# Patient Record
Sex: Male | Born: 1950
Health system: Southern US, Community
[De-identification: ages and names within clinical notes are randomized; demographics above are authoritative.]

## PROBLEM LIST (undated history)

## (undated) DIAGNOSIS — K219 Gastro-esophageal reflux disease without esophagitis: Secondary | ICD-10-CM

## (undated) DIAGNOSIS — I10 Essential (primary) hypertension: Secondary | ICD-10-CM

## (undated) DIAGNOSIS — G5601 Carpal tunnel syndrome, right upper limb: Secondary | ICD-10-CM

## (undated) DIAGNOSIS — M1990 Unspecified osteoarthritis, unspecified site: Secondary | ICD-10-CM

## (undated) DIAGNOSIS — Z8489 Family history of other specified conditions: Secondary | ICD-10-CM

## (undated) DIAGNOSIS — Z972 Presence of dental prosthetic device (complete) (partial): Secondary | ICD-10-CM

## (undated) HISTORY — PX: OTHER SURGICAL HISTORY: SHX169

## (undated) HISTORY — PX: TONSILLECTOMY: SUR1361

---

## 1996-11-05 HISTORY — PX: HAND TENDON SURGERY: SHX663

## 2004-03-24 ENCOUNTER — Ambulatory Visit (HOSPITAL_BASED_OUTPATIENT_CLINIC_OR_DEPARTMENT_OTHER): Admission: RE | Admit: 2004-03-24 | Discharge: 2004-03-24 | Payer: Self-pay | Admitting: Orthopedic Surgery

## 2004-03-24 ENCOUNTER — Ambulatory Visit (HOSPITAL_COMMUNITY): Admission: RE | Admit: 2004-03-24 | Discharge: 2004-03-24 | Payer: Self-pay | Admitting: Orthopedic Surgery

## 2004-09-19 ENCOUNTER — Ambulatory Visit (HOSPITAL_COMMUNITY): Admission: RE | Admit: 2004-09-19 | Discharge: 2004-09-19 | Payer: Self-pay | Admitting: Orthopedic Surgery

## 2004-09-19 ENCOUNTER — Ambulatory Visit (HOSPITAL_BASED_OUTPATIENT_CLINIC_OR_DEPARTMENT_OTHER): Admission: RE | Admit: 2004-09-19 | Discharge: 2004-09-19 | Payer: Self-pay | Admitting: Orthopedic Surgery

## 2011-03-07 ENCOUNTER — Other Ambulatory Visit: Payer: Self-pay | Admitting: Internal Medicine

## 2011-03-07 DIAGNOSIS — M5430 Sciatica, unspecified side: Secondary | ICD-10-CM

## 2011-03-07 DIAGNOSIS — M545 Low back pain: Secondary | ICD-10-CM

## 2011-03-12 ENCOUNTER — Ambulatory Visit
Admission: RE | Admit: 2011-03-12 | Discharge: 2011-03-12 | Disposition: A | Discharge status: Home / Self Care | Payer: 59 | Source: Ambulatory Visit | Attending: Internal Medicine | Admitting: Internal Medicine

## 2011-03-12 DIAGNOSIS — M5430 Sciatica, unspecified side: Secondary | ICD-10-CM

## 2011-03-12 DIAGNOSIS — M545 Low back pain: Secondary | ICD-10-CM

## 2011-04-04 ENCOUNTER — Other Ambulatory Visit: Payer: Self-pay | Admitting: Neurosurgery

## 2011-04-04 DIAGNOSIS — M25552 Pain in left hip: Secondary | ICD-10-CM

## 2011-04-05 ENCOUNTER — Ambulatory Visit
Admission: RE | Admit: 2011-04-05 | Discharge: 2011-04-05 | Disposition: A | Discharge status: Home / Self Care | Payer: 59 | Source: Ambulatory Visit | Attending: Neurosurgery | Admitting: Neurosurgery

## 2011-04-05 DIAGNOSIS — M25552 Pain in left hip: Secondary | ICD-10-CM

## 2011-06-21 ENCOUNTER — Other Ambulatory Visit: Payer: Self-pay | Admitting: Neurosurgery

## 2011-06-21 DIAGNOSIS — M25559 Pain in unspecified hip: Secondary | ICD-10-CM

## 2011-06-27 ENCOUNTER — Ambulatory Visit
Admission: RE | Admit: 2011-06-27 | Discharge: 2011-06-27 | Disposition: A | Discharge status: Home / Self Care | Payer: 59 | Source: Ambulatory Visit | Attending: Neurosurgery | Admitting: Neurosurgery

## 2011-06-27 DIAGNOSIS — M25559 Pain in unspecified hip: Secondary | ICD-10-CM

## 2011-06-27 MED ORDER — IOHEXOL 180 MG/ML  SOLN: 1.0000 mL | Freq: Once | INTRAMUSCULAR | Status: DC | PRN

## 2011-06-27 MED ORDER — METHYLPREDNISOLONE ACETATE 40 MG/ML INJ SUSP (RADIOLOG
120.0000 mg | Freq: Once | INTRAMUSCULAR | Status: AC
  Administered 2011-06-27: 120 mg via INTRA_ARTICULAR

## 2011-06-27 MED ORDER — IOHEXOL 180 MG/ML  SOLN
1.0000 mL | Freq: Once | INTRAMUSCULAR | Status: AC | PRN
  Administered 2011-06-27: 1 mL via INTRA_ARTICULAR

## 2011-09-24 ENCOUNTER — Encounter (HOSPITAL_COMMUNITY): Payer: Self-pay | Admitting: Pharmacy Technician

## 2011-09-25 ENCOUNTER — Ambulatory Visit (HOSPITAL_COMMUNITY)
Admission: RE | Admit: 2011-09-25 | Discharge: 2011-09-25 | Disposition: A | Discharge status: Home / Self Care | Payer: 59 | Source: Ambulatory Visit | Attending: Orthopedic Surgery | Admitting: Orthopedic Surgery

## 2011-09-25 ENCOUNTER — Other Ambulatory Visit: Payer: Self-pay | Admitting: Orthopedic Surgery

## 2011-09-25 ENCOUNTER — Encounter (HOSPITAL_COMMUNITY): Payer: Self-pay

## 2011-09-25 ENCOUNTER — Encounter (HOSPITAL_COMMUNITY)
Admission: RE | Admit: 2011-09-25 | Discharge: 2011-09-25 | Disposition: A | Discharge status: Home / Self Care | Payer: 59 | Source: Ambulatory Visit | Attending: Orthopedic Surgery | Admitting: Orthopedic Surgery

## 2011-09-25 DIAGNOSIS — Z01812 Encounter for preprocedural laboratory examination: Secondary | ICD-10-CM | POA: Insufficient documentation

## 2011-09-25 DIAGNOSIS — M169 Osteoarthritis of hip, unspecified: Secondary | ICD-10-CM | POA: Insufficient documentation

## 2011-09-25 DIAGNOSIS — M1610 Unilateral primary osteoarthritis, unspecified hip: Secondary | ICD-10-CM | POA: Insufficient documentation

## 2011-09-25 DIAGNOSIS — M25559 Pain in unspecified hip: Secondary | ICD-10-CM | POA: Insufficient documentation

## 2011-09-25 HISTORY — DX: Unspecified osteoarthritis, unspecified site: M19.90

## 2011-09-25 HISTORY — DX: Gastro-esophageal reflux disease without esophagitis: K21.9

## 2011-09-25 LAB — CBC
MCH: 32.5 pg (ref 26.0–34.0)
MCV: 91.9 fL (ref 78.0–100.0)
Platelets: 281 10*3/uL (ref 150–400)
RDW: 13 % (ref 11.5–15.5)

## 2011-09-25 LAB — COMPREHENSIVE METABOLIC PANEL
AST: 20 U/L (ref 0–37)
Albumin: 3.9 g/dL (ref 3.5–5.2)
Calcium: 9 mg/dL (ref 8.4–10.5)
Creatinine, Ser: 0.95 mg/dL (ref 0.50–1.35)
GFR calc non Af Amer: 89 mL/min — ABNORMAL LOW (ref >90)

## 2011-09-25 LAB — URINALYSIS, ROUTINE W REFLEX MICROSCOPIC
Bilirubin Urine: NEGATIVE
Hgb urine dipstick: NEGATIVE
Ketones, ur: NEGATIVE mg/dL
Protein, ur: NEGATIVE mg/dL
Urobilinogen, UA: 1 mg/dL (ref 0.0–1.0)

## 2011-09-25 LAB — APTT: aPTT: 35 seconds (ref 24–37)

## 2011-09-25 LAB — PROTIME-INR
INR: 1.03 (ref 0.00–1.49)
Prothrombin Time: 13.7 seconds (ref 11.6–15.2)

## 2011-09-25 MED ORDER — CHLORHEXIDINE GLUCONATE 4 % EX LIQD: 60.0000 mL | Freq: Once | CUTANEOUS | Status: DC

## 2011-09-25 NOTE — H&P (Signed)
George Roberts  DOB: 04/22/1951  Date Of Admission:  10/02/2101  Chief Complaint:  Left Hip Pain  History of Present Illness The patient is a 60 year old male who comes in today for a preoperative History and Physical. The patient is scheduled for a left total hip arthroplasty to be performed by Dr. Frank V. Aluisio, MD at [facility] on 10/03/2011.  Allergies No Known Drug Allergies. Vicodin *ANALGESICS - OPIOID*. Headache. *INTOLERANCE ONLY* Patient has taken Percocet with previous surgery and tolerated before.  Medication History Ibuprofen (200MG Capsule, Oral) Active.  Problem List/Past Medical Osteoarthritis  Family History Diabetes Mellitus. father Heart Disease. father Congestive Heart Failure. father and grandmother fathers side Cancer. grandfather mothers side Hypertension. father Rheumatoid Arthritis. grandmother mothers side  Social History Illicit drug use. no Living situation. live with spouse Marital status. married Current work status. Retired. Tobacco use. former smoker; smoke(d) 1 1/2 pack(s) per day Exercise. Exercises rarely; does other Children. 2 Alcohol use. current drinker; only occasionally per week  Past Surgical History Left Hand Surgery. Date: 2005.  Review of Systems General:Not Present- Chills, Fever, Night Sweats, Appetite Loss, Fatigue, Feeling sick, Weight Gain and Weight Loss. Skin:Not Present- Itching, Rash, Skin Color Changes, Ulcer, Psoriasis and Change in Hair or Nails. HEENT:Not Present- Sensitivity to light, Hearing problems, Nose Bleed and Ringing in the Ears. Neck:Not Present- Swollen Glands and Neck Mass. Respiratory:Not Present- Snoring, Chronic Cough, Bloody sputum and Dyspnea. Cardiovascular:Not Present- Shortness of Breath, Chest Pain, Swelling of Extremities, Leg Cramps and Palpitations. Gastrointestinal:Not Present- Bloody Stool, Heartburn, Abdominal Pain, Vomiting,  Nausea and Incontinence of Stool. Male Genitourinary:Not Present- Blood in Urine, Frequency, Incontinence and Nocturia. Musculoskeletal:Present- Joint Pain and Back Pain. Not Present- Muscle Weakness, Muscle Pain, Joint Stiffness and Joint Swelling. Neurological:Not Present- Tingling, Numbness, Burning, Tremor, Headaches and Dizziness. Psychiatric:Not Present- Anxiety, Depression and Memory Loss.  Endocrine:Not Present- Cold Intolerance, Heat Intolerance, Excessive hunger and Excessive Thirst. Hematology:Not Present- Abnormal Bleeding, Anemia, Blood Clots and Easy Bruising.  Vitals Weight: 195 lb Height: 69 in Weight was reported by patient. Height was reported by patient. Body Surface Area: 2.08 m Body Mass Index: 28.8 kg/m Pulse: 72 (Regular) Resp.: 12 (Unlabored) BP: 138/82 (Sitting, Left Arm, Standard)  Physical Exam The physical exam findings are as follows: Note: Patient is a 60 year old male seen for ongoing left hip problems.  General Mental Status - Alert, cooperative and good historian. General Appearance- pleasant and Cooperative. Not in acute distress. Orientation- Oriented X3. Build & Nutrition- Well nourished and Well developed. Note: Accompanied with his wife Becky.  Head and Neck Head- normocephalic, atraumatic . Neck Global Assessment- supple. no bruit auscultated on the right and no bruit auscultated on the left.  Eye Pupil- Bilateral- Normal, Regular and Round. Motion- Bilateral- EOMI.  Chest and Lung Exam Auscultation: Breath sounds:- clear at anterior chest wall and - clear at posterior chest wall. Adventitious sounds:- No Adventitious sounds.  Cardiovascular Auscultation:Rhythm- Regular rate and rhythm. Heart Sounds- S1 WNL and S2 WNL. Murmurs & Other Heart Sounds:Auscultation of the heart reveals - No Murmurs.  Abdomen Palpation/Percussion:Tenderness- Abdomen is non-tender to palpation. Rigidity  (guarding)- Abdomen is soft. Auscultation:Auscultation of the abdomen reveals - Bowel sounds normal.  Male Genitourinary Note: Not done, not pertinent to present illness  Musculoskeletal Note: On physical exam a well developed male, alert and oriented in no apparent distress. The left hip can be flexed to 95, no internal rotation, but 10 degrees of external rotation, 20 degrees of   abduction. He does have discomfort on attempted range of motion of the left hip. The right hip has normal motion.  RADIOGRAPHS: Reviewed his x rays from visit in August. He does have bone on bone change in the left hip.  Assessment & Plan Osteoarthritis Left Hip  Note: Plan is for a left total hip replacement. Surgey to be done by Dr. Aluisio.  Risks and benefits of the surgery have been discussed with the patient and they elect to proceed with surgery.  There are on active contraindications to upcoming procedure such as ongoing infection or progressive neurological disease.  Drew Perkins, PA-C   

## 2011-09-25 NOTE — Patient Instructions (Signed)
20 KROY SPRUNG  09/25/2011   Your procedure is scheduled on: 10/03/11 1130am-115 pm  Report to [facility] at 0930 AM.  Call this number if you have problems the morning of surgery: [phone]   Remember:   Do not eat food:After Midnight.  Do not drink clear liquids: After Midnight.  Take these medicines the morning of surgery with A SIP OF WATER: none    Do not wear jewelry,   Do not wear lotions, powders, or perfumes.      Do not bring valuables to the hospital.  Contacts, dentures or bridgework may not be worn into surgery.  Leave suitcase in the car. After surgery it may be brought to your room.  For patients admitted to the hospital, checkout time is 11:00 AM the day of discharge.       Special Instructions: CHG Shower Use Special Wash: 1/2 bottle night before surgery and 1/2 bottle morning of surgery. Shower chin to toes with CHG.  Wash face and private parts with regular soap.   Please read over the following fact sheets that you were given: MRSA Information, Blood transfusion fact sheet, leg exercises, coughing and deep breathing exercises, Incentive Spirometry fact sheet

## 2011-10-03 ENCOUNTER — Encounter (HOSPITAL_COMMUNITY): Payer: Self-pay | Admitting: Anesthesiology

## 2011-10-03 ENCOUNTER — Inpatient Hospital Stay (HOSPITAL_COMMUNITY)
Admission: RE | Admit: 2011-10-03 | Discharge: 2011-10-05 | DRG: 470 | Disposition: A | Discharge status: Home Health | Payer: 59 | Source: Ambulatory Visit | Attending: Orthopedic Surgery | Admitting: Orthopedic Surgery

## 2011-10-03 ENCOUNTER — Inpatient Hospital Stay (HOSPITAL_COMMUNITY): Payer: 59

## 2011-10-03 ENCOUNTER — Encounter (HOSPITAL_COMMUNITY): Payer: Self-pay | Admitting: *Deleted

## 2011-10-03 ENCOUNTER — Encounter (HOSPITAL_COMMUNITY)
Admission: RE | Disposition: A | Discharge status: Home Health | Payer: Self-pay | Source: Ambulatory Visit | Attending: Orthopedic Surgery

## 2011-10-03 ENCOUNTER — Inpatient Hospital Stay (HOSPITAL_COMMUNITY): Payer: 59 | Admitting: Anesthesiology

## 2011-10-03 DIAGNOSIS — M169 Osteoarthritis of hip, unspecified: Principal | ICD-10-CM | POA: Diagnosis present

## 2011-10-03 DIAGNOSIS — K219 Gastro-esophageal reflux disease without esophagitis: Secondary | ICD-10-CM | POA: Diagnosis present

## 2011-10-03 DIAGNOSIS — M1610 Unilateral primary osteoarthritis, unspecified hip: Principal | ICD-10-CM | POA: Diagnosis present

## 2011-10-03 HISTORY — PX: TOTAL HIP ARTHROPLASTY: SHX124

## 2011-10-03 LAB — TYPE AND SCREEN
ABO/RH(D): O POS
Antibody Screen: NEGATIVE

## 2011-10-03 LAB — ABO/RH: ABO/RH(D): O POS

## 2011-10-03 SURGERY — ARTHROPLASTY, HIP, TOTAL,POSTERIOR APPROACH
Anesthesia: General | Site: Hip | Laterality: Left | Wound class: Clean

## 2011-10-03 MED ORDER — BUPIVACAINE LIPOSOME 1.3 % IJ SUSP
20.0000 mL | Freq: Once | INTRAMUSCULAR | Status: AC
  Administered 2011-10-03: 20 mL
  Filled 2011-10-03: qty 20

## 2011-10-03 MED ORDER — METHOCARBAMOL 100 MG/ML IJ SOLN
500.0000 mg | Freq: Four times a day (QID) | INTRAVENOUS | Status: DC | PRN
  Administered 2011-10-03: 500 mg via INTRAVENOUS
  Filled 2011-10-03: qty 5

## 2011-10-03 MED ORDER — MORPHINE SULFATE 4 MG/ML IJ SOLN
1.0000 mg | INTRAMUSCULAR | Status: DC | PRN
  Administered 2011-10-03 – 2011-10-04 (×4): 2 mg via INTRAVENOUS
  Filled 2011-10-03 (×3): qty 1

## 2011-10-03 MED ORDER — ACETAMINOPHEN 10 MG/ML IV SOLN
1000.0000 mg | Freq: Four times a day (QID) | INTRAVENOUS | Status: AC
  Administered 2011-10-03 – 2011-10-04 (×4): 1000 mg via INTRAVENOUS
  Filled 2011-10-03 (×9): qty 100

## 2011-10-03 MED ORDER — SODIUM CHLORIDE 0.9 % IJ SOLN
INTRAMUSCULAR | Status: DC | PRN
  Administered 2011-10-03: 50 mL via INTRAVENOUS

## 2011-10-03 MED ORDER — CISATRACURIUM BESYLATE 2 MG/ML IV SOLN
INTRAVENOUS | Status: DC | PRN
  Administered 2011-10-03: 10 mg via INTRAVENOUS

## 2011-10-03 MED ORDER — OXYCODONE HCL 5 MG PO TABS
5.0000 mg | ORAL_TABLET | ORAL | Status: DC | PRN
  Administered 2011-10-03: 10 mg via ORAL
  Administered 2011-10-04 (×4): 5 mg via ORAL
  Administered 2011-10-05 (×3): 10 mg via ORAL
  Filled 2011-10-03: qty 1
  Filled 2011-10-03: qty 2
  Filled 2011-10-03 (×2): qty 1
  Filled 2011-10-03 (×2): qty 2
  Filled 2011-10-03: qty 1
  Filled 2011-10-03: qty 2

## 2011-10-03 MED ORDER — PROMETHAZINE HCL 25 MG/ML IJ SOLN: 6.2500 mg | INTRAMUSCULAR | Status: DC | PRN

## 2011-10-03 MED ORDER — PHENOL 1.4 % MT LIQD: 1.0000 | OROMUCOSAL | Status: DC | PRN

## 2011-10-03 MED ORDER — BUPIVACAINE 0.25 % ON-Q PUMP SINGLE CATH 300ML: 300.0000 mL | INJECTION | Status: DC

## 2011-10-03 MED ORDER — BISACODYL 5 MG PO TBEC: 10.0000 mg | DELAYED_RELEASE_TABLET | Freq: Every day | ORAL | Status: DC | PRN

## 2011-10-03 MED ORDER — MORPHINE SULFATE 2 MG/ML IJ SOLN: 1.0000 mg | INTRAMUSCULAR | Status: DC | PRN

## 2011-10-03 MED ORDER — RIVAROXABAN 10 MG PO TABS
10.0000 mg | ORAL_TABLET | Freq: Every day | ORAL | Status: DC
  Administered 2011-10-04 – 2011-10-05 (×2): 10 mg via ORAL
  Filled 2011-10-03 (×2): qty 1

## 2011-10-03 MED ORDER — DOCUSATE SODIUM 100 MG PO CAPS
100.0000 mg | ORAL_CAPSULE | Freq: Two times a day (BID) | ORAL | Status: DC
  Administered 2011-10-03 – 2011-10-05 (×4): 100 mg via ORAL
  Filled 2011-10-03 (×5): qty 1

## 2011-10-03 MED ORDER — ONDANSETRON HCL 4 MG PO TABS: 4.0000 mg | ORAL_TABLET | Freq: Four times a day (QID) | ORAL | Status: DC | PRN

## 2011-10-03 MED ORDER — METOCLOPRAMIDE HCL 5 MG/ML IJ SOLN: 5.0000 mg | Freq: Three times a day (TID) | INTRAMUSCULAR | Status: DC | PRN

## 2011-10-03 MED ORDER — KCL IN DEXTROSE-NACL 20-5-0.45 MEQ/L-%-% IV SOLN
INTRAVENOUS | Status: DC
  Administered 2011-10-03 – 2011-10-04 (×2): via INTRAVENOUS
  Administered 2011-10-05: 20 mL via INTRAVENOUS
  Filled 2011-10-03 (×3): qty 1000

## 2011-10-03 MED ORDER — GLYCOPYRROLATE 0.2 MG/ML IJ SOLN
INTRAMUSCULAR | Status: DC | PRN
  Administered 2011-10-03: .6 mg via INTRAVENOUS

## 2011-10-03 MED ORDER — CEFAZOLIN SODIUM 1-5 GM-% IV SOLN
1.0000 g | Freq: Four times a day (QID) | INTRAVENOUS | Status: AC
  Administered 2011-10-03 – 2011-10-04 (×3): 1 g via INTRAVENOUS
  Filled 2011-10-03 (×4): qty 50

## 2011-10-03 MED ORDER — LACTATED RINGERS IV SOLN
INTRAVENOUS | Status: DC
  Administered 2011-10-03: 13:00:00 via INTRAVENOUS
  Administered 2011-10-03: 1000 mL via INTRAVENOUS
  Administered 2011-10-03: 13:00:00 via INTRAVENOUS
  Administered 2011-10-03: 1000 mL via INTRAVENOUS

## 2011-10-03 MED ORDER — ONDANSETRON HCL 4 MG/2ML IJ SOLN: 4.0000 mg | Freq: Four times a day (QID) | INTRAMUSCULAR | Status: DC | PRN

## 2011-10-03 MED ORDER — MORPHINE SULFATE 4 MG/ML IJ SOLN
INTRAMUSCULAR | Status: AC
  Administered 2011-10-03: 2 mg via INTRAVENOUS
  Filled 2011-10-03: qty 1

## 2011-10-03 MED ORDER — MENTHOL 3 MG MT LOZG
1.0000 | LOZENGE | OROMUCOSAL | Status: DC | PRN
  Filled 2011-10-03: qty 9

## 2011-10-03 MED ORDER — FLEET ENEMA 7-19 GM/118ML RE ENEM: 1.0000 | ENEMA | Freq: Every day | RECTAL | Status: DC | PRN

## 2011-10-03 MED ORDER — MIDAZOLAM HCL 5 MG/5ML IJ SOLN
INTRAMUSCULAR | Status: DC | PRN
  Administered 2011-10-03: 2 mg via INTRAVENOUS

## 2011-10-03 MED ORDER — CEFAZOLIN SODIUM-DEXTROSE 2-3 GM-% IV SOLR
2.0000 g | Freq: Once | INTRAVENOUS | Status: AC
  Administered 2011-10-03: 2 g via INTRAVENOUS

## 2011-10-03 MED ORDER — HYDROMORPHONE HCL PF 2 MG/ML IJ SOLN
INTRAMUSCULAR | Status: AC
  Filled 2011-10-03: qty 1

## 2011-10-03 MED ORDER — MAGNESIUM HYDROXIDE 400 MG/5ML PO SUSP: 30.0000 mL | Freq: Two times a day (BID) | ORAL | Status: DC | PRN

## 2011-10-03 MED ORDER — NEOSTIGMINE METHYLSULFATE 1 MG/ML IJ SOLN
INTRAMUSCULAR | Status: DC | PRN
  Administered 2011-10-03: 4 mg via INTRAVENOUS

## 2011-10-03 MED ORDER — PROPOFOL 10 MG/ML IV EMUL
INTRAVENOUS | Status: DC | PRN
  Administered 2011-10-03: 200 mg via INTRAVENOUS

## 2011-10-03 MED ORDER — ONDANSETRON HCL 4 MG/2ML IJ SOLN
INTRAMUSCULAR | Status: DC | PRN
  Administered 2011-10-03: 4 mg via INTRAVENOUS

## 2011-10-03 MED ORDER — ACETAMINOPHEN 650 MG RE SUPP: 650.0000 mg | Freq: Four times a day (QID) | RECTAL | Status: DC | PRN

## 2011-10-03 MED ORDER — ACETAMINOPHEN 10 MG/ML IV SOLN
INTRAVENOUS | Status: DC | PRN
  Administered 2011-10-03: 1000 mg via INTRAVENOUS

## 2011-10-03 MED ORDER — HYDROMORPHONE HCL PF 1 MG/ML IJ SOLN
0.2500 mg | INTRAMUSCULAR | Status: DC | PRN
  Administered 2011-10-03 (×3): 0.5 mg via INTRAVENOUS

## 2011-10-03 MED ORDER — DEXAMETHASONE SODIUM PHOSPHATE 10 MG/ML IJ SOLN
INTRAMUSCULAR | Status: DC | PRN
  Administered 2011-10-03: 10 mg via INTRAVENOUS

## 2011-10-03 MED ORDER — POLYETHYLENE GLYCOL 3350 17 G PO PACK
17.0000 g | PACK | Freq: Every day | ORAL | Status: DC | PRN
  Filled 2011-10-03: qty 1

## 2011-10-03 MED ORDER — METHOCARBAMOL 500 MG PO TABS
500.0000 mg | ORAL_TABLET | Freq: Four times a day (QID) | ORAL | Status: DC | PRN
  Administered 2011-10-04 – 2011-10-05 (×4): 500 mg via ORAL
  Filled 2011-10-03 (×4): qty 1

## 2011-10-03 MED ORDER — METOCLOPRAMIDE HCL 10 MG PO TABS
5.0000 mg | ORAL_TABLET | Freq: Three times a day (TID) | ORAL | Status: DC | PRN
Start: 2011-10-03 — End: 2011-10-05

## 2011-10-03 MED ORDER — DIPHENHYDRAMINE HCL 12.5 MG/5ML PO ELIX
12.5000 mg | ORAL_SOLUTION | ORAL | Status: DC | PRN
Start: 2011-10-03 — End: 2011-10-05

## 2011-10-03 MED ORDER — ACETAMINOPHEN 325 MG PO TABS: 650.0000 mg | ORAL_TABLET | Freq: Four times a day (QID) | ORAL | Status: DC | PRN

## 2011-10-03 MED ORDER — SODIUM CHLORIDE 0.9 % IR SOLN
Status: DC | PRN
  Administered 2011-10-03: 1000 mL

## 2011-10-03 MED ORDER — SUFENTANIL CITRATE 50 MCG/ML IV SOLN
INTRAVENOUS | Status: DC | PRN
  Administered 2011-10-03: 10 ug via INTRAVENOUS
  Administered 2011-10-03: 5 ug via INTRAVENOUS
  Administered 2011-10-03: 20 ug via INTRAVENOUS
  Administered 2011-10-03: 10 ug via INTRAVENOUS

## 2011-10-03 MED ORDER — TEMAZEPAM 15 MG PO CAPS
15.0000 mg | ORAL_CAPSULE | Freq: Every evening | ORAL | Status: DC | PRN
Start: 2011-10-03 — End: 2011-10-05

## 2011-10-03 MED ORDER — KETAMINE HCL 10 MG/ML IJ SOLN
INTRAMUSCULAR | Status: DC | PRN
  Administered 2011-10-03: 10 mg via INTRAVENOUS
  Administered 2011-10-03: 30 mg via INTRAVENOUS
  Administered 2011-10-03: 10 mg via INTRAVENOUS

## 2011-10-03 MED ORDER — BISACODYL 10 MG RE SUPP: 10.0000 mg | Freq: Every day | RECTAL | Status: DC | PRN

## 2011-10-03 MED ORDER — LIDOCAINE HCL (CARDIAC) 20 MG/ML IV SOLN
INTRAVENOUS | Status: DC | PRN
  Administered 2011-10-03: 100 mg via INTRAVENOUS

## 2011-10-03 SURGICAL SUPPLY — 49 items
BAG SPEC THK2 15X12 ZIP CLS (MISCELLANEOUS) ×1
BAG ZIPLOCK 12X15 (MISCELLANEOUS) ×2 IMPLANT
BIT DRILL 2.8X128 (BIT) ×2 IMPLANT
BLADE EXTENDED COATED 6.5IN (ELECTRODE) ×2 IMPLANT
BLADE SAW SAG 73X25 THK (BLADE) ×1
BLADE SAW SGTL 73X25 THK (BLADE) ×1 IMPLANT
CATH KIT ON-Q SILVERSOAK 5 (CATHETERS) IMPLANT
CATH KIT ON-Q SILVERSOAK 5IN (CATHETERS) IMPLANT
CLOSURE STERI STRIP 1/2 X4 (GAUZE/BANDAGES/DRESSINGS) ×2 IMPLANT
CLOTH BEACON ORANGE TIMEOUT ST (SAFETY) ×2 IMPLANT
DRAPE INCISE IOBAN 66X45 STRL (DRAPES) ×2 IMPLANT
DRAPE ORTHO SPLIT 77X108 STRL (DRAPES) ×4
DRAPE POUCH INSTRU U-SHP 10X18 (DRAPES) ×2 IMPLANT
DRAPE SURG ORHT 6 SPLT 77X108 (DRAPES) ×2 IMPLANT
DRAPE U-SHAPE 47X51 STRL (DRAPES) ×2 IMPLANT
DRSG ADAPTIC 3X8 NADH LF (GAUZE/BANDAGES/DRESSINGS) ×2 IMPLANT
DRSG MEPILEX BORDER 4X4 (GAUZE/BANDAGES/DRESSINGS) ×2 IMPLANT
DRSG MEPILEX BORDER 4X8 (GAUZE/BANDAGES/DRESSINGS) ×1 IMPLANT
DURAPREP 26ML APPLICATOR (WOUND CARE) ×2 IMPLANT
ELECT REM PT RETURN 9FT ADLT (ELECTROSURGICAL) ×2
ELECTRODE REM PT RTRN 9FT ADLT (ELECTROSURGICAL) ×1 IMPLANT
EVACUATOR 1/8 PVC DRAIN (DRAIN) ×2 IMPLANT
FACESHIELD LNG OPTICON STERILE (SAFETY) ×8 IMPLANT
GAUZE SPONGE 4X4 12PLY STRL LF (GAUZE/BANDAGES/DRESSINGS) ×1 IMPLANT
GLOVE BIO SURGEON STRL SZ7.5 (GLOVE) ×2 IMPLANT
GLOVE BIO SURGEON STRL SZ8 (GLOVE) ×2 IMPLANT
GLOVE BIOGEL PI IND STRL 8 (GLOVE) ×2 IMPLANT
GLOVE BIOGEL PI INDICATOR 8 (GLOVE) ×2
GOWN PREVENTION PLUS XLARGE (GOWN DISPOSABLE) ×2 IMPLANT
GOWN STRL REIN XL XLG (GOWN DISPOSABLE) ×2 IMPLANT
IMMOBILIZER KNEE 20 (SOFTGOODS)
IMMOBILIZER KNEE 20 THIGH 36 (SOFTGOODS) IMPLANT
KIT BASIN OR (CUSTOM PROCEDURE TRAY) ×2 IMPLANT
MANIFOLD NEPTUNE II (INSTRUMENTS) ×2 IMPLANT
NS IRRIG 1000ML POUR BTL (IV SOLUTION) ×2 IMPLANT
PACK TOTAL JOINT (CUSTOM PROCEDURE TRAY) ×2 IMPLANT
PASSER SUT SWANSON 36MM LOOP (INSTRUMENTS) ×2 IMPLANT
POSITIONER SURGICAL ARM (MISCELLANEOUS) ×2 IMPLANT
SPONGE GAUZE 4X4 12PLY (GAUZE/BANDAGES/DRESSINGS) ×2 IMPLANT
STRIP CLOSURE SKIN 1/2X4 (GAUZE/BANDAGES/DRESSINGS) ×4 IMPLANT
SUT ETHIBOND NAB CT1 #1 30IN (SUTURE) ×4 IMPLANT
SUT MNCRL AB 4-0 PS2 18 (SUTURE) ×2 IMPLANT
SUT VIC AB 1 CT1 27 (SUTURE) ×6
SUT VIC AB 1 CT1 27XBRD ANTBC (SUTURE) ×3 IMPLANT
SUT VIC AB 2-0 CT1 27 (SUTURE) ×6
SUT VIC AB 2-0 CT1 TAPERPNT 27 (SUTURE) ×3 IMPLANT
TOWEL OR 17X26 10 PK STRL BLUE (TOWEL DISPOSABLE) ×4 IMPLANT
TRAY FOLEY CATH 14FRSI W/METER (CATHETERS) ×2 IMPLANT
WATER STERILE IRR 1500ML POUR (IV SOLUTION) ×2 IMPLANT

## 2011-10-03 NOTE — Anesthesia Postprocedure Evaluation (Signed)
  Anesthesia Post-op Note  Patient: George Roberts  Procedure(s) Performed:  TOTAL HIP ARTHROPLASTY  Patient Location: PACU  Anesthesia Type: General  Level of Consciousness: alert  and oriented  Airway and Oxygen Therapy: Patient Spontanous Breathing and Patient connected to nasal cannula oxygen  Post-op Pain: mild  Post-op Assessment: Post-op Vital signs reviewed, Patient's Cardiovascular Status Stable, Respiratory Function Stable and Patent Airway  Post-op Vital Signs: stable  Complications: No apparent anesthesia complications

## 2011-10-03 NOTE — H&P (View-Only) (Signed)
George Roberts  DOB: September 21, 1951  Date Of Admission:  10/02/2101  Chief Complaint:  Left Hip Pain  History of Present Illness The patient is a 60 year old male who comes in today for a preoperative History and Physical. The patient is scheduled for a left total hip arthroplasty to be performed by Dr. Gus Rankin. Aluisio, MD at [facility] on 10/03/2011.  Allergies No Known Drug Allergies. Vicodin *ANALGESICS - OPIOID*. Headache. *INTOLERANCE ONLY* Patient has taken Percocet with previous surgery and tolerated before.  Medication History Ibuprofen (200MG  Capsule, Oral) Active.  Problem List/Past Medical Osteoarthritis  Family History Diabetes Mellitus. father Heart Disease. father Congestive Heart Failure. father and grandmother fathers side Cancer. grandfather mothers side Hypertension. father Rheumatoid Arthritis. grandmother mothers side  Social History Illicit drug use. no Living situation. live with spouse Marital status. married Current work status. Retired. Tobacco use. former smoker; smoke(d) 1 1/2 pack(s) per day Exercise. Exercises rarely; does other Children. 2 Alcohol use. current drinker; only occasionally per week  Past Surgical History Left Hand Surgery. Date: 2005.  Review of Systems General:Not Present- Chills, Fever, Night Sweats, Appetite Loss, Fatigue, Feeling sick, Weight Gain and Weight Loss. Skin:Not Present- Itching, Rash, Skin Color Changes, Ulcer, Psoriasis and Change in Hair or Nails. HEENT:Not Present- Sensitivity to light, Hearing problems, Nose Bleed and Ringing in the Ears. Neck:Not Present- Swollen Glands and Neck Mass. Respiratory:Not Present- Snoring, Chronic Cough, Bloody sputum and Dyspnea. Cardiovascular:Not Present- Shortness of Breath, Chest Pain, Swelling of Extremities, Leg Cramps and Palpitations. Gastrointestinal:Not Present- Bloody Stool, Heartburn, Abdominal Pain, Vomiting,  Nausea and Incontinence of Stool. Male Genitourinary:Not Present- Blood in Urine, Frequency, Incontinence and Nocturia. Musculoskeletal:Present- Joint Pain and Back Pain. Not Present- Muscle Weakness, Muscle Pain, Joint Stiffness and Joint Swelling. Neurological:Not Present- Tingling, Numbness, Burning, Tremor, Headaches and Dizziness. Psychiatric:Not Present- Anxiety, Depression and Memory Loss.  Endocrine:Not Present- Cold Intolerance, Heat Intolerance, Excessive hunger and Excessive Thirst. Hematology:Not Present- Abnormal Bleeding, Anemia, Blood Clots and Easy Bruising.  Vitals Weight: 195 lb Height: 69 in Weight was reported by patient. Height was reported by patient. Body Surface Area: 2.08 m Body Mass Index: 28.8 kg/m Pulse: 72 (Regular) Resp.: 12 (Unlabored) BP: 138/82 (Sitting, Left Arm, Standard)  Physical Exam The physical exam findings are as follows: Note: Patient is a 60 year old male seen for ongoing left hip problems.  General Mental Status - Alert, cooperative and good historian. General Appearance- pleasant and Cooperative. Not in acute distress. Orientation- Oriented X3. Build & Nutrition- Well nourished and Well developed. Note: Accompanied with his wife George Roberts.  Head and Neck Head- normocephalic, atraumatic . Neck Global Assessment- supple. no bruit auscultated on the right and no bruit auscultated on the left.  Eye Pupil- Bilateral- Normal, Regular and Round. Motion- Bilateral- EOMI.  Chest and Lung Exam Auscultation: Breath sounds:- clear at anterior chest wall and - clear at posterior chest wall. Adventitious sounds:- No Adventitious sounds.  Cardiovascular Auscultation:Rhythm- Regular rate and rhythm. Heart Sounds- S1 WNL and S2 WNL. Murmurs & Other Heart Sounds:Auscultation of the heart reveals - No Murmurs.  Abdomen Palpation/Percussion:Tenderness- Abdomen is non-tender to palpation. Rigidity  (guarding)- Abdomen is soft. Auscultation:Auscultation of the abdomen reveals - Bowel sounds normal.  Male Genitourinary Note: Not done, not pertinent to present illness  Musculoskeletal Note: On physical exam a well developed male, alert and oriented in no apparent distress. The left hip can be flexed to 95, no internal rotation, but 10 degrees of external rotation, 20 degrees of  abduction. He does have discomfort on attempted range of motion of the left hip. The right hip has normal motion.  RADIOGRAPHS: Reviewed his x rays from visit in August. He does have bone on bone change in the left hip.  Assessment & Plan Osteoarthritis Left Hip  Note: Plan is for a left total hip replacement. Surgey to be done by Dr. Lequita Halt.  Risks and benefits of the surgery have been discussed with the patient and they elect to proceed with surgery.  There are on active contraindications to upcoming procedure such as ongoing infection or progressive neurological disease.  Avel Peace, PA-C

## 2011-10-03 NOTE — Transfer of Care (Signed)
Immediate Anesthesia Transfer of Care Note  Patient: George Roberts  Procedure(s) Performed:  TOTAL HIP ARTHROPLASTY  Patient Location: PACU  Anesthesia Type: General  Level of Consciousness: oriented and sedated  Airway & Oxygen Therapy: Patient Spontanous Breathing and Patient connected to face mask oxygen  Post-op Assessment: Report given to PACU RN and Post -op Vital signs reviewed and stable  Post vital signs: Reviewed and stable  Complications: No apparent anesthesia complications

## 2011-10-03 NOTE — Interval H&P Note (Signed)
History and Physical Interval Note:  10/03/2011 12:21 PM  George Roberts  has presented today for surgery, with the diagnosis of osteoarthritis left hip  The various methods of treatment have been discussed with the patient and family. After consideration of risks, benefits and other options for treatment, the patient has consented to  Procedure(s): TOTAL HIP ARTHROPLASTY as a surgical intervention .  The patients' history has been reviewed, patient examined, no change in status, stable for surgery.  I have reviewed the patients' chart and labs.  Questions were answered to the patient's satisfaction.     Loanne Drilling

## 2011-10-03 NOTE — Op Note (Signed)
Pre-operative diagnosis- Osteoarthritis Left hip  Post-operative diagnosis- Osteoarthritis  Left hip  Procedure-  LeftTotal Hip Arthroplasty  Surgeon- Gus Rankin. Astha Probasco, MD  Assistant- Avel Peace, PA-C   Anesthesia  General  EBL- 400   Drain Hemovac   Complication- None  Condition-PACU - hemodynamically stable.   Brief Clinical Note- George Roberts is a 60 y.o. male with end stage arthritis of his left hip with progressively worsening pain and dysfunction. Pain occurs with activity and rest including pain at night. He has tried analgesics, protected weight bearing and rest without benefit. Pain is too severe to attempt physical therapy. Radiographs demonstrate bone on bone arthritis with subchondral cyst formation. He presents now for left THA.  Procedure in detail-   The patient is brought into the operating room and placed on the operating table. After successful administration of General   anesthesia, the patient is placed in the  Right lateral decubitus position with the  Left side up and held in place with the hip positioner. The lower extremity is isolated from the perineum with plastic drapes and time-out is performed by the surgical team. The lower extremity is then prepped and draped in the usual sterile fashion. A short posterolateral incision is made with a ten blade through the subcutaneous tissue to the level of the fascia lata which is incised in line with the skin incision. The sciatic nerve is palpated and protected and the short external rotators and capsule are isolated from the femur. The hip is then dislocated and the center of the femoral head is marked. A trial prosthesis is placed such that the trial head corresponds to the center of the patients' native femoral head. The resection level is marked on the femoral neck and the resection is made with an oscillating saw. The femoral head is removed and femoral retractors placed to gain access to the femoral canal.  The canal finder is passed into the femoral canal and the canal is thoroughly irrigated with sterile saline to remove the fatty contents. Axial reaming is performed to 15.5  mm, proximal reaming to 70F   and the sleeve machined to a large. A 70F large trial sleeve is placed into the proximal femur.      The femur is then retracted anteriorly to gain acetabular exposure. Acetabular retractors are placed and the labrum and osteophytes are removed, Acetabular reaming is performed to 53  mm and a 54  mm Pinnacle acetabular shell is placed in anatomic position with excellent purchase. Additional dome screws were placed. An apex hole eliminator is placed and the permanent 36 mm neutral plus 4 Marathon liner is placed into the acetabular shell.      The trial femur is then placed into the femoral canal. The size is 20 x 15  stem with a 36 + 12  neck and a 36 + 6 head with the neck version 10 degrees beyond  the patients' native anteversion. The hip is reduced with excellent stability with full extension and full external rotation, 70 degrees flexion with 40 degrees adduction and 90 degrees internal rotation and 90 degrees of flexion with 70 degrees of internal rotation. The operative leg is placed on top of the non-operative leg and the leg lengths are found to be equal. The trials are then removed and the permanent implant of the same size is impacted into the femoral canal. The ceramic femoral head of the same size as the trial is placed and the hip is reduced with  the same stability parameters. The operative leg is again placed on top of the non-operative leg and the leg lengths are found to be equal.      The wound is then copiously irrigated with saline solution and the capsule and short external rotators are re-attached to the femur through drill holes with Ethibond suture. The fascia lata is closed over a hemovac drain with #1 vicryl suture and the fascia lata, gluteal muscles and subcutaneous tissues are  injected with Exparel 20ml diluted with saline 50ml. The subcutaneous tissues are closed with #1 and2-0 vicryl and the subcuticular layer closed with running 4-0 Monocryl. The drain is hooked to suction, incision cleaned and dried, and steri-srips and a bulky sterile dressing applied. The limb is placed into a knee immobilizer and the patient is awakened and transported to recovery in stable condition.      Please note that a surgical assistant was a medical necessity for this procedure in order to perform it in a safe and expeditious manner. The assistant was necessary to provide retraction to the vital neurovascular structures and to retract and position the limb to allow for anatomic placement of the prosthetic components.  Gus Rankin George Perkovich, MD    10/03/2011, 1:43 PM

## 2011-10-03 NOTE — Anesthesia Procedure Notes (Addendum)
Procedure Name: Intubation Date/Time: 10/03/2011 12:30 PM Performed by: Lurlean Leyden, DIANA L. Patient Re-evaluated:Patient Re-evaluated prior to inductionOxygen Delivery Method: Circle System Utilized Preoxygenation: Pre-oxygenation with 100% oxygen Intubation Type: IV induction Ventilation: Mask ventilation without difficulty and Oral airway inserted - appropriate to patient size Laryngoscope Size: Miller and 3 Grade View: Grade II Tube type: Oral Tube size: 8.0 mm Airway Equipment and Method: stylet Placement Confirmation: ETT inserted through vocal cords under direct vision,  breath sounds checked- equal and bilateral and positive ETCO2 Secured at: 22 cm Tube secured with: Tape Dental Injury: Teeth and Oropharynx as per pre-operative assessment

## 2011-10-03 NOTE — Preoperative (Signed)
Beta Blockers   Reason not to administer Beta Blockers:Not Applicable 

## 2011-10-03 NOTE — Anesthesia Preprocedure Evaluation (Signed)
Anesthesia Evaluation  Patient identified by MRN, date of birth, ID band Patient awake    Reviewed: Allergy & Precautions, H&P , NPO status , Patient's Chart, lab work & pertinent test results, reviewed documented beta blocker date and time   Airway       Dental  (+) Upper Dentures   Pulmonary neg pulmonary ROS,          Cardiovascular neg cardio ROS  Denies cardiac symptoms   Neuro/Psych Negative Neurological ROS  Negative Psych ROS   GI/Hepatic negative GI ROS, Neg liver ROS,   Endo/Other  Negative Endocrine ROS  Renal/GU negative Renal ROS  Genitourinary negative   Musculoskeletal negative musculoskeletal ROS (+)   Abdominal   Peds negative pediatric ROS (+)  Hematology negative hematology ROS (+)   Anesthesia Other Findings   Reproductive/Obstetrics negative OB ROS                           Anesthesia Physical Anesthesia Plan  ASA: I  Anesthesia Plan: General   Post-op Pain Management:    Induction: Intravenous  Airway Management Planned: Oral ETT  Additional Equipment:   Intra-op Plan:   Post-operative Plan: Extubation in OR  Informed Consent: I have reviewed the patients History and Physical, chart, labs and discussed the procedure including the risks, benefits and alternatives for the proposed anesthesia with the patient or authorized representative who has indicated his/her understanding and acceptance.     Plan Discussed with: CRNA and Surgeon  Anesthesia Plan Comments:         Anesthesia Quick Evaluation

## 2011-10-03 NOTE — Progress Notes (Signed)
Pt arrived to floor on stretcher, slid self to bed. ORiented to callbell and environment. Assessment as charted. POC discussed w/ pt and family at bedside. Pt taking sips of water w/o difficulty. Denies nausea.Will medicate for pain as ordered.

## 2011-10-04 LAB — CBC
HCT: 36.9 % — ABNORMAL LOW (ref 39.0–52.0)
Hemoglobin: 13.2 g/dL (ref 13.0–17.0)
MCH: 33.1 pg (ref 26.0–34.0)
MCV: 92.5 fL (ref 78.0–100.0)
Platelets: 273 10*3/uL (ref 150–400)
RBC: 3.99 MIL/uL — ABNORMAL LOW (ref 4.22–5.81)
WBC: 15.9 10*3/uL — ABNORMAL HIGH (ref 4.0–10.5)

## 2011-10-04 LAB — BASIC METABOLIC PANEL
CO2: 26 mEq/L (ref 19–32)
Calcium: 8.6 mg/dL (ref 8.4–10.5)
Chloride: 103 mEq/L (ref 96–112)
Creatinine, Ser: 0.74 mg/dL (ref 0.50–1.35)
Glucose, Bld: 146 mg/dL — ABNORMAL HIGH (ref 70–99)

## 2011-10-04 NOTE — Progress Notes (Signed)
Subjective: 1 Day Post-Op Procedure(s) (LRB): TOTAL HIP ARTHROPLASTY (Left) Patient reports pain as mild.   Patient seen in rounds with Dr. Lequita Halt. Patient has complaints of not much sleep otherwise doing well. Had a decent night after surgery  We will start therapy today. Plan is to go home after hospital stay. Foley has been removed this morning.  Objective: Vital signs in last 24 hours: Temp:  [97 F (36.1 C)-98.7 F (37.1 C)] 97.6 F (36.4 C) (11/29 0600) Pulse Rate:  [82-113] 87  (11/29 0600) Resp:  [10-20] 18  (11/29 0600) BP: (112-147)/(71-90) 123/83 mmHg (11/29 0600) SpO2:  [94 %-98 %] 96 % (11/29 0600) FiO2 (%):  [3 %] 3 % (11/28 1645) Weight:  [88.905 kg (196 lb)] 196 lb (88.905 kg) (11/28 1545)  Intake/Output from previous day:  Intake/Output Summary (Last 24 hours) at 10/04/11 0801 Last data filed at 10/04/11 0500  Gross per 24 hour  Intake   3560 ml  Output   3370 ml  Net    190 ml    Intake/Output this shift:    Labs: Results for orders placed during the hospital encounter of 10/03/11  URINALYSIS, ROUTINE W REFLEX MICROSCOPIC      Component Value Range   Color, Urine YELLOW  YELLOW    APPearance CLEAR  CLEAR    Specific Gravity, Urine 1.021  1.005 - 1.030    pH 6.0  5.0 - 8.0    Glucose, UA NEGATIVE  NEGATIVE (mg/dL)   Hgb urine dipstick NEGATIVE  NEGATIVE    Bilirubin Urine NEGATIVE  NEGATIVE    Ketones, ur NEGATIVE  NEGATIVE (mg/dL)   Protein, ur NEGATIVE  NEGATIVE (mg/dL)   Urobilinogen, UA 1.0  0.0 - 1.0 (mg/dL)   Nitrite NEGATIVE  NEGATIVE    Leukocytes, UA NEGATIVE  NEGATIVE   TYPE AND SCREEN      Component Value Range   ABO/RH(D) O POS     Antibody Screen NEG     Sample Expiration 10/06/2011    ABO/RH      Component Value Range   ABO/RH(D) O POS    CBC      Component Value Range   WBC 15.9 (*) 4.0 - 10.5 (K/uL)   RBC 3.99 (*) 4.22 - 5.81 (MIL/uL)   Hemoglobin 13.2  13.0 - 17.0 (g/dL)   HCT 16.1 (*) 09.6 - 52.0 (%)   MCV 92.5  78.0 -  100.0 (fL)   MCH 33.1  26.0 - 34.0 (pg)   MCHC 35.8  30.0 - 36.0 (g/dL)   RDW 04.5  40.9 - 81.1 (%)   Platelets 273  150 - 400 (K/uL)  BASIC METABOLIC PANEL      Component Value Range   Sodium 138  135 - 145 (mEq/L)   Potassium 4.3  3.5 - 5.1 (mEq/L)   Chloride 103  96 - 112 (mEq/L)   CO2 26  19 - 32 (mEq/L)   Glucose, Bld 146 (*) 70 - 99 (mg/dL)   BUN 11  6 - 23 (mg/dL)   Creatinine, Ser 9.14  0.50 - 1.35 (mg/dL)   Calcium 8.6  8.4 - 78.2 (mg/dL)   GFR calc non Af Amer >90  >90 (mL/min)   GFR calc Af Amer >90  >90 (mL/min)    Exam - Neurovascular intact Sensation intact distally Dressing - clean, dry, no drainage Motor function intact - moving foot and toes well on exam.  Hemovac pulled without difficulty.  Assessment/Plan: 1 Day Post-Op Procedure(s) (  LRB): TOTAL HIP ARTHROPLASTY (Left)  Past Medical History  Diagnosis Date  . GERD (gastroesophageal reflux disease)   . Arthritis     left hip     Advance diet Up with therapy Discharge home with home health when met goals  DVT Prophylaxis - Xarelto  Protocol Partial-Weight Bearing 25-50% left Leg D/C Knee Immobilizer Hemovac Pulled Begin Therapy Hip Preacutions No vaccines.  George Roberts 10/04/2011, 8:01 AM

## 2011-10-04 NOTE — Progress Notes (Addendum)
Physical Therapy Evaluation Patient Details Name: George Roberts MRN: [id number] DOB: 09-18-1951 Today's Date: 10/04/2011 Time: 859-931 Charge: Claudia Desanctis  Problem List: There is no problem list on file for this patient.   Past Medical History:  Past Medical History  Diagnosis Date  . GERD (gastroesophageal reflux disease)   . Arthritis     left hip    Past Surgical History:  Past Surgical History  Procedure Date  . Other surgical history     left thumb surgery     PT Assessment/Plan/Recommendation PT Assessment Clinical Impression Statement: Pt s/p L THR and plans to move to [place] in 3 weeks.  Pt would benefit from acute PT services in order to improve strength and AROM of L LE to improve transfers and ambulation in order to prepare for D/C home with spouse.  Pt and spouse report they will try to find RW to use at home and will PT know of DME needs prior to D/C. PT Recommendation/Assessment: Patient will need skilled PT in the acute care venue PT Problem List: Decreased strength;Decreased range of motion;Decreased mobility;Decreased knowledge of precautions;Decreased knowledge of use of DME;Pain PT Therapy Diagnosis : Difficulty walking;Acute pain PT Plan PT Frequency: 7X/week PT Treatment/Interventions: DME instruction;Gait training;Stair training;Functional mobility training;Therapeutic exercise;Patient/family education PT Recommendation Recommendations for Other Services: OT consult Follow Up Recommendations: Home health PT Equipment Recommended: Rolling walker with 5" wheels (may need if cannot acquire one prior to D/C) PT Goals  Acute Rehab PT Goals PT Goal Formulation: With patient Time For Goal Achievement: 7 days Pt will go Supine/Side to Sit: with modified independence PT Goal: Supine/Side to Sit - Progress: Progressing toward goal Pt will Transfer Sit to Stand/Stand to Sit: with modified independence PT Transfer Goal: Sit to Stand/Stand to Sit -  Progress: Progressing toward goal Pt will Ambulate: >150 feet;with modified independence;with least restrictive assistive device PT Goal: Ambulate - Progress: Progressing toward goal Pt will Go Up / Down Stairs: 1-2 stairs;with rolling walker PT Goal: Up/Down Stairs - Progress: Other (comment) Pt will Perform Home Exercise Program: with supervision, verbal cues required/provided  PT Evaluation Precautions/Restrictions  Precautions Precautions: Posterior Hip Restrictions Weight Bearing Restrictions: Yes LLE Weight Bearing: Partial weight bearing LLE Partial Weight Bearing Percentage or Pounds: 25-50% Prior Functioning  Home Living Lives With: Spouse Type of Home: House Home Layout: One level Home Access: Stairs to enter Entrance Stairs-Rails: None Entrance Stairs-Number of Steps: 2 Home Adaptive Equipment:  (rollator) Prior Function Level of Independence: Independent with basic ADLs;Independent with gait Comments: Moving to [place] in 3 weeks. Cognition Cognition Arousal/Alertness: Awake/alert Overall Cognitive Status: Appears within functional limits for tasks assessed Orientation Level: Oriented X4 Sensation/Coordination   Extremity Assessment RUE Assessment RUE Assessment: Within Functional Limits LUE Assessment LUE Assessment: Within Functional Limits RLE Assessment RLE Assessment: Within Functional Limits LLE Assessment LLE Assessment: Not tested (ankle 5/5, able to move 50% AROM against gravity within THP) Mobility (including Balance) Bed Mobility Bed Mobility: Yes Supine to Sit: 4: Min assist;HOB elevated (Comment degrees) Supine to Sit Details (indicate cue type and reason): support and guiding of L LE off bed, verbal cue for not letting L LE internally rotate with transfer Transfers Transfers: Yes Sit to Stand: 4: Min assist;From elevated surface;With upper extremity assist;From bed Sit to Stand Details (indicate cue type and reason): min/guard, verbal  cues for hand placement and L LE forward Stand to Sit: 4: Min assist;To chair/3-in-1 Stand to Sit Details: min/guard, verbal cues for hand placement  and L LE forward Ambulation/Gait Ambulation/Gait: Yes Ambulation/Gait Assistance: 4: Min assist Ambulation/Gait Assistance Details (indicate cue type and reason): min/guard, verbal cues for sequence  and safe RW distance Ambulation Distance (Feet): 220 Feet Assistive device: Rolling walker Gait Pattern: Step-to pattern;Antalgic Gait velocity: slow cadence    Exercise  Total Joint Exercises Ankle Circles/Pumps: AROM;Both;20 reps;Seated (reclined in recliner) Quad Sets: Strengthening;AROM;Left;20 reps;Seated Short Arc Quad: Strengthening;AROM;Left (20 reps) Heel Slides: 20 reps;Left;Strengthening;AROM;Seated (within precaution) Hip ABduction/ADduction: AROM;Strengthening;Seated;Left (9 reps) End of Session PT - End of Session Activity Tolerance: Patient tolerated treatment well Patient left: in chair;with call bell in reach;with family/visitor present General Behavior During Session: [facility] for tasks performed Cognition: [facility] for tasks performed  Miela Desjardin,KATHrine E 10/04/2011, 11:09 AM Pager: [phone]

## 2011-10-04 NOTE — Progress Notes (Signed)
Physical Therapy Treatment Patient Details Name: George Roberts MRN: [id number] DOB: Dec 07, 1950 Today's Date: 10/04/2011 Time: 2952-8413 Charge: Leonia Reeves PT Assessment/Plan  PT - Assessment/Plan Comments on Treatment Session: Pt reports soreness from ambulation this morning but agreeable to perform stairs as pt hopes to D/C tomorrow.  Pt would also like RW for home if insurance will cover.  PT Plan: Discharge plan remains appropriate;Frequency remains appropriate Follow Up Recommendations: Home health PT Equipment Recommended: Rolling walker with 5" wheels PT Goals  Acute Rehab PT Goals PT Transfer Goal: Sit to Stand/Stand to Sit - Progress: Progressing toward goal PT Goal: Ambulate - Progress: Progressing toward goal Pt will Go Up / Down Stairs: 1-2 stairs;with supervision;with rolling walker PT Goal: Up/Down Stairs - Progress: Progressing toward goal PT Goal: Perform Home Exercise Program - Progress: Progressing toward goal  PT Treatment Precautions/Restrictions  Precautions Precautions: Posterior Hip Restrictions Weight Bearing Restrictions: Yes LLE Weight Bearing: Partial weight bearing LLE Partial Weight Bearing Percentage or Pounds: 25-50% Mobility (including Balance) Bed Mobility Bed Mobility: Yes Sit to Supine - Left: 5: Supervision;HOB elevated (comment degrees) Sit to Supine - Left Details (indicate cue type and reason): HOB slightly elevated, verbal cue to maintain hip precautions Transfers Transfers: Yes Sit to Stand: 4: Min assist;With armrests;From chair/3-in-1 Sit to Stand Details (indicate cue type and reason): min/guard, verbal cues for hand placement and L LE forward Stand to Sit: 4: Min assist;With upper extremity assist;To bed Stand to Sit Details: min/guard, verbal cues for hand placement and L LE forward Ambulation/Gait Ambulation/Gait: Yes Ambulation/Gait Assistance: 4: Min assist Ambulation/Gait Assistance Details (indicate cue type and reason): min/guard,  pt able to verbalize and demonstrate PWB, 20 feet x2 ambulating to stairs and then back to bed Ambulation Distance (Feet): 20 Feet Assistive device: Rolling walker Gait Pattern: Step-to pattern;Antalgic Stairs: Yes Stairs Assistance: 4: Min assist Stairs Assistance Details (indicate cue type and reason): min/guard, spouse present for stairs, verbal cues for sequence, RW placement, and hand placement Stair Management Technique: Backwards;With walker Number of Stairs: 3     Exercise  Total Joint Exercises Ankle Circles/Pumps: AROM;Both;20 reps;Supine Hip ABduction/ADduction: AROM;Strengthening;20 reps;Supine;Left End of Session PT - End of Session Activity Tolerance: Patient tolerated treatment well Patient left: in bed;with call bell in reach;with family/visitor present General Behavior During Session: [facility] for tasks performed Cognition: [facility] for tasks performed  Elliette Seabolt,KATHrine E 10/04/2011, 3:40 PM Pager: [phone]

## 2011-10-05 ENCOUNTER — Encounter (HOSPITAL_COMMUNITY): Payer: Self-pay | Admitting: Orthopedic Surgery

## 2011-10-05 LAB — CBC
HCT: 36.8 % — ABNORMAL LOW (ref 39.0–52.0)
MCH: 32.8 pg (ref 26.0–34.0)
MCV: 92.9 fL (ref 78.0–100.0)
Platelets: 234 10*3/uL (ref 150–400)
RBC: 3.96 MIL/uL — ABNORMAL LOW (ref 4.22–5.81)
RDW: 13.1 % (ref 11.5–15.5)
WBC: 11.6 10*3/uL — ABNORMAL HIGH (ref 4.0–10.5)

## 2011-10-05 LAB — BASIC METABOLIC PANEL
BUN: 11 mg/dL (ref 6–23)
CO2: 28 mEq/L (ref 19–32)
Calcium: 8.3 mg/dL — ABNORMAL LOW (ref 8.4–10.5)
Chloride: 102 mEq/L (ref 96–112)
Creatinine, Ser: 0.86 mg/dL (ref 0.50–1.35)

## 2011-10-05 MED ORDER — OXYCODONE HCL 5 MG PO TABS: 5.0000 mg | ORAL_TABLET | ORAL | Status: AC | PRN

## 2011-10-05 MED ORDER — RIVAROXABAN 10 MG PO TABS: 10.0000 mg | ORAL_TABLET | Freq: Every day | ORAL | Status: DC

## 2011-10-05 MED ORDER — METHOCARBAMOL 500 MG PO TABS: 500.0000 mg | ORAL_TABLET | Freq: Four times a day (QID) | ORAL | Status: AC | PRN

## 2011-10-05 NOTE — Progress Notes (Signed)
Occupational Therapy Evaluation Patient Details Name: George Roberts MRN: [id number] DOB: [birth year] Today's Date: 10/05/2011  Problem List: There is no problem list on file for this patient. [id number]  Past Medical History:  Past Medical History  Diagnosis Date  . GERD (gastroesophageal reflux disease)   . Arthritis     left hip    Past Surgical History:  Past Surgical History  Procedure Date  . Other surgical history     left thumb surgery     OT Assessment/Plan/Recommendation OT Assessment Clinical Impression Statement: All education completed. Pt will have necessary A @ home from wife. Recommend 3:1 for home use. OT Recommendation/Assessment: Patient does not need any further OT services OT Recommendation Equipment Recommended: Rolling walker with 5" wheels;3 in 1 bedside comode OT Goals    OT Evaluation Precautions/Restrictions  Precautions Precautions: Posterior Hip Restrictions Weight Bearing Restrictions: Yes LLE Weight Bearing: Partial weight bearing LLE Partial Weight Bearing Percentage or Pounds: 25-50% Prior Functioning Home Living Receives Help From: Family Bathroom Shower/Tub: Walk-in Stage manager: Standard Bathroom Accessibility: Yes How Accessible: Accessible via walker Home Adaptive Equipment: Built-in shower seat;Other (comment) (rollator) Prior Function Driving: Yes ADL ADL Grooming: Simulated;Modified independent Where Assessed - Grooming: Standing at sink Upper Body Bathing: Simulated;Set up Where Assessed - Upper Body Bathing: Sitting, chair;Unsupported Lower Body Bathing: Set up;Other (comment) (w/long handled sponge) Where Assessed - Lower Body Bathing: Sit to stand from chair Upper Body Dressing: Set up;Simulated Where Assessed - Upper Body Dressing: Sitting, chair;Unsupported Lower Body Dressing: Simulated;Set up;Other (comment) (w/reacher & sock aid) Lower Body Dressing Details (indicate cue type and reason): Pt  states that his wife would likely A w/ bathing & dressing Where Assessed - Lower Body Dressing: Sit to stand from chair Toilet Transfer: Performed;Supervision/safety Toilet Transfer Details (indicate cue type and reason): VCs for hand placement Toilet Transfer Method: Ambulating Toilet Transfer Equipment: Raised toilet seat with arms (or 3-in-1 over toilet);Other (comment) (RW) Toileting - Clothing Manipulation: Simulated;Supervision/safety Where Assessed - Toileting Clothing Manipulation: Sit to stand from 3-in-1 or toilet Toileting - Hygiene: Simulated;Supervision/safety Where Assessed - Toileting Hygiene: Sit to stand from 3-in-1 or toilet Tub/Shower Transfer: Performed;Supervision/safety Tub/Shower Transfer Method: Ambulating (Performed backwards method w/RW) Psychologist, educational: Walk in shower Equipment Used: Reacher;Rolling walker;Sock aid (3:1) Vision/Perception    Cognition Cognition Arousal/Alertness: Awake/alert Overall Cognitive Status: Appears within functional limits for tasks assessed Orientation Level: Oriented X4 Sensation/Coordination   Extremity Assessment RUE Assessment RUE Assessment: Within Functional Limits LUE Assessment LUE Assessment: Within Functional Limits Mobility  Bed Mobility Bed Mobility: No Transfers Transfers: Yes Sit to Stand: 5: Supervision;With armrests;From chair/3-in-1 Sit to Stand Details (indicate cue type and reason): VCs for hand placement Stand to Sit: 6: Modified independent (Device/Increase time);With armrests Exercises   End of Session OT - End of Session Equipment Utilized During Treatment: Other (comment) (RW, 3:1, reacher, sock aid) Activity Tolerance: Patient tolerated treatment well Patient left: in chair;with call bell in reach;with family/visitor present General Behavior During Session: [facility] for tasks performed Cognition: [facility] for tasks performed   Beyonca Wisz A [phone]  10/05/2011, 9:29 AM

## 2011-10-05 NOTE — Progress Notes (Signed)
Patient alert and oriented. Discharge instructions and prescriptions given to patient, verbalized understanding of instructions. PT in with patient x 2 today. Is being sent home with walker and bedside commode. Iv site removed, catheter tip intact. Patient left unit in wheelchair accompanied by wife and staff.

## 2011-10-05 NOTE — Progress Notes (Signed)
Physical Therapy Treatment Patient Details Name: George Roberts MRN: [id number] DOB: 14-Nov-1950 Today's Date: 10/05/2011 Time: 4098-1191 Charge: Leonia Reeves PT Assessment/Plan  PT - Assessment/Plan Comments on Treatment Session: Pt ready to D/C home.  Reviewed hip precautions and maintaining them with functional activities such as pulling up pants, bed transfers, and sit to stand.  Pt to take home hip precaution sheet.  Pt ready to D/C home. PT Plan: Discharge plan remains appropriate;Frequency remains appropriate Follow Up Recommendations: Home health PT Equipment Recommended: Rolling walker with 5" wheels;3 in 1 bedside comode PT Goals  Acute Rehab PT Goals PT Transfer Goal: Sit to Stand/Stand to Sit - Progress: Progressing toward goal PT Goal: Ambulate - Progress: Progressing toward goal PT Goal: Perform Home Exercise Program - Progress: Met  PT Treatment Precautions/Restrictions  Precautions Precautions: Posterior Hip Restrictions Weight Bearing Restrictions: Yes LLE Weight Bearing: Partial weight bearing LLE Partial Weight Bearing Percentage or Pounds: 25-50% Mobility (including Balance) Bed Mobility Bed Mobility: No Transfers Transfers: Yes Sit to Stand: 5: Supervision;With armrests;From chair/3-in-1 Sit to Stand Details (indicate cue type and reason): verbal cue for L LE forward Stand to Sit: 5: Supervision;With armrests;To chair/3-in-1 Stand to Sit Details: verbal cue for L LE forward Ambulation/Gait Ambulation/Gait: Yes Ambulation/Gait Assistance: 5: Supervision Ambulation/Gait Assistance Details (indicate cue type and reason): verbal cue for safe RW distance Ambulation Distance (Feet): 50 Feet Assistive device: Rolling walker Gait Pattern: Step-to pattern;Antalgic Gait velocity: slow cadence    Exercise  Total Joint Exercises Ankle Circles/Pumps: AROM;Both;20 reps;Seated Quad Sets: Strengthening;AROM;20 reps;Seated;Both Gluteal Sets:  AROM;Strengthening;Both;Seated Short Arc Quad: Strengthening;AROM;10 reps;Seated Hip ABduction/ADduction: 10 reps;Strengthening;AROM;Left;Seated End of Session PT - End of Session Activity Tolerance: Patient tolerated treatment well Patient left: in chair;with call bell in reach;with family/visitor present General Behavior During Session: [facility] for tasks performed Cognition: [facility] for tasks performed  Kenyanna Grzesiak,KATHrine E 10/05/2011, 1:51 PM Pager: [phone]

## 2011-10-05 NOTE — Progress Notes (Signed)
Physical Therapy Treatment Patient Details Name: George Roberts MRN: [id number] DOB: 02-Apr-1951 Today's Date: 10/05/2011 Time: 4098-1191 Charge: TE PT Assessment/Plan  PT - Assessment/Plan Comments on Treatment Session: Pt reports rough night from soreness, so requested exercises in AM and limited ambulation in PM since he ambulated a good distance yesterday.  Pt to D/C after PM therapy session.  Discussed car transfer with pt and spouse. PT Plan: Discharge plan remains appropriate;Frequency remains appropriate Follow Up Recommendations: Home health PT Equipment Recommended: Rolling walker with 5" wheels;3 in 1 bedside comode PT Goals  Acute Rehab PT Goals PT Goal: Perform Home Exercise Program - Progress: Progressing toward goal  PT Treatment Precautions/Restrictions  Precautions Precautions: Posterior Hip Restrictions Weight Bearing Restrictions: Yes LLE Weight Bearing: Partial weight bearing LLE Partial Weight Bearing Percentage or Pounds: 25-50% Pt able to verbalize all hip precautions and PWB status. Mobility (including Balance) Bed Mobility Bed Mobility: No Transfers Transfers: No Ambulation/Gait Ambulation/Gait: No    Exercise  Total Joint Exercises Ankle Circles/Pumps: AROM;Both;20 reps;Seated (seated for all exercises reclined in recliner) Quad Sets: Strengthening;AROM;Left;20 reps;Seated Gluteal Sets: AROM;Strengthening;Both;Seated (20 reps) Short Arc Quad: Strengthening;AROM;Left;Seated (20 reps) Heel Slides: 20 reps;Left;Strengthening;AROM;Seated Hip ABduction/ADduction: AROM;Strengthening;20 reps;Seated End of Session PT - End of Session Activity Tolerance: Patient tolerated treatment well Patient left: in chair;with call bell in reach;with family/visitor present General Behavior During Session: [facility] for tasks performed Cognition: [facility] for tasks performed  Merdith Boyd,KATHrine E 10/05/2011, 11:28 AM Pager: [phone]

## 2011-10-05 NOTE — Progress Notes (Signed)
CM consult -spoke with patient and spouse and plans are for patient to return to his home in [place] where spouse will be caregiver. States he will need RW and 3N1. Wants HH agency that is in network. [facility] called by CM; advised that they would be able to provide HHpt services with start date within 48hrs of discharge. [facility] notified of DME needs. Equipment will be delivered to patient's room.

## 2011-10-05 NOTE — Progress Notes (Signed)
Brief Discharge Note Subjective: 2 Days Post-Op Procedure(s) (LRB): TOTAL HIP ARTHROPLASTY (Left) Patient reports pain as mild.   Patient seen in rounds with Dr. Lequita Halt. Patient has complaints of soreness but otherwise doing well and progressing with therapy and ready to go home.  Objective: Vital signs in last 24 hours: Temp:  [97.3 F (36.3 C)-101.1 F (38.4 C)] 99.5 F (37.5 C) (11/30 0500) Pulse Rate:  [99-108] 101  (11/30 0500) Resp:  [18-20] 18  (11/30 0500) BP: (104-119)/(67-76) 111/73 mmHg (11/30 0500) SpO2:  [92 %-99 %] 92 % (11/30 0500)  Intake/Output from previous day:  Intake/Output Summary (Last 24 hours) at 10/05/11 1001 Last data filed at 10/05/11 0700  Gross per 24 hour  Intake    720 ml  Output    600 ml  Net    120 ml    Intake/Output this shift:    Labs: Results for orders placed during the hospital encounter of 10/03/11  URINALYSIS, ROUTINE W REFLEX MICROSCOPIC      Component Value Range   Color, Urine YELLOW  YELLOW    APPearance CLEAR  CLEAR    Specific Gravity, Urine 1.021  1.005 - 1.030    pH 6.0  5.0 - 8.0    Glucose, UA NEGATIVE  NEGATIVE (mg/dL)   Hgb urine dipstick NEGATIVE  NEGATIVE    Bilirubin Urine NEGATIVE  NEGATIVE    Ketones, ur NEGATIVE  NEGATIVE (mg/dL)   Protein, ur NEGATIVE  NEGATIVE (mg/dL)   Urobilinogen, UA 1.0  0.0 - 1.0 (mg/dL)   Nitrite NEGATIVE  NEGATIVE    Leukocytes, UA NEGATIVE  NEGATIVE   TYPE AND SCREEN      Component Value Range   ABO/RH(D) O POS     Antibody Screen NEG     Sample Expiration 10/06/2011    ABO/RH      Component Value Range   ABO/RH(D) O POS    CBC      Component Value Range   WBC 15.9 (*) 4.0 - 10.5 (K/uL)   RBC 3.99 (*) 4.22 - 5.81 (MIL/uL)   Hemoglobin 13.2  13.0 - 17.0 (g/dL)   HCT 09.8 (*) 11.9 - 52.0 (%)   MCV 92.5  78.0 - 100.0 (fL)   MCH 33.1  26.0 - 34.0 (pg)   MCHC 35.8  30.0 - 36.0 (g/dL)   RDW 14.7  82.9 - 56.2 (%)   Platelets 273  150 - 400 (K/uL)  BASIC METABOLIC PANEL        Component Value Range   Sodium 138  135 - 145 (mEq/L)   Potassium 4.3  3.5 - 5.1 (mEq/L)   Chloride 103  96 - 112 (mEq/L)   CO2 26  19 - 32 (mEq/L)   Glucose, Bld 146 (*) 70 - 99 (mg/dL)   BUN 11  6 - 23 (mg/dL)   Creatinine, Ser 1.30  0.50 - 1.35 (mg/dL)   Calcium 8.6  8.4 - 86.5 (mg/dL)   GFR calc non Af Amer >90  >90 (mL/min)   GFR calc Af Amer >90  >90 (mL/min)  CBC      Component Value Range   WBC 11.6 (*) 4.0 - 10.5 (K/uL)   RBC 3.96 (*) 4.22 - 5.81 (MIL/uL)   Hemoglobin 13.0  13.0 - 17.0 (g/dL)   HCT 78.4 (*) 69.6 - 52.0 (%)   MCV 92.9  78.0 - 100.0 (fL)   MCH 32.8  26.0 - 34.0 (pg)   MCHC 35.3  30.0 -  36.0 (g/dL)   RDW 29.5  62.1 - 30.8 (%)   Platelets 234  150 - 400 (K/uL)  BASIC METABOLIC PANEL      Component Value Range   Sodium 136  135 - 145 (mEq/L)   Potassium 3.8  3.5 - 5.1 (mEq/L)   Chloride 102  96 - 112 (mEq/L)   CO2 28  19 - 32 (mEq/L)   Glucose, Bld 105 (*) 70 - 99 (mg/dL)   BUN 11  6 - 23 (mg/dL)   Creatinine, Ser 6.57  0.50 - 1.35 (mg/dL)   Calcium 8.3 (*) 8.4 - 10.5 (mg/dL)   GFR calc non Af Amer >90  >90 (mL/min)   GFR calc Af Amer >90  >90 (mL/min)    Exam: Neurovascular intact Sensation intact distally clean, dry, no drainage Motor function intact - moving foot and toes well on exam.   Assessment/Plan: 2 Days Post-Op Procedure(s) (LRB): TOTAL HIP ARTHROPLASTY (Left) Advance diet Up with therapy Discharge home with home health Procedure(s) (LRB): TOTAL HIP ARTHROPLASTY (Left) Past Medical History  Diagnosis Date  . GERD (gastroesophageal reflux disease)   . Arthritis     left hip     Regular diet F/U - in 2 weeks PWB 25-50% COD - Good  DVT Prophylaxis - Xarelto Protocol   George Roberts 10/05/2011, 10:01 AM

## 2011-10-07 NOTE — Discharge Summary (Signed)
Physician Discharge Summary   Patient ID: George Roberts MRN: [id number] DOB/AGE: Feb 01, 1951 60 y.o.  Admit date: 10/03/2011 Discharge date: 10/05/2011  Primary Diagnosis: Osteoarthritis Left Hip   Admission Diagnoses: Past Medical History  Diagnosis Date  . GERD (gastroesophageal reflux disease)   . Arthritis     left hip     Discharge Diagnoses:  Active Problems:  * No active hospital problems. *    Procedure: Procedure(s) (LRB): TOTAL HIP ARTHROPLASTY (Left)   Consults: none  HPI: George Roberts is a 60 y.o. male with end stage arthritis of his left hip with progressively worsening pain and dysfunction. Pain occurs with activity and rest including pain at night. He has tried analgesics, protected weight bearing and rest without benefit. Pain is too severe to attempt physical therapy. Radiographs demonstrate bone on bone arthritis with subchondral cyst formation. He presents now for left THA.  Laboratory Data: Results for orders placed during the hospital encounter of 10/03/11  URINALYSIS, ROUTINE W REFLEX MICROSCOPIC      Component Value Range   Color, Urine YELLOW  YELLOW    APPearance CLEAR  CLEAR    Specific Gravity, Urine 1.021  1.005 - 1.030    pH 6.0  5.0 - 8.0    Glucose, UA NEGATIVE  NEGATIVE (mg/dL)   Hgb urine dipstick NEGATIVE  NEGATIVE    Bilirubin Urine NEGATIVE  NEGATIVE    Ketones, ur NEGATIVE  NEGATIVE (mg/dL)   Protein, ur NEGATIVE  NEGATIVE (mg/dL)   Urobilinogen, UA 1.0  0.0 - 1.0 (mg/dL)   Nitrite NEGATIVE  NEGATIVE    Leukocytes, UA NEGATIVE  NEGATIVE   TYPE AND SCREEN      Component Value Range   ABO/RH(D) O POS     Antibody Screen NEG     Sample Expiration 10/06/2011    ABO/RH      Component Value Range   ABO/RH(D) O POS    CBC      Component Value Range   WBC 15.9 (*) 4.0 - 10.5 (K/uL)   RBC 3.99 (*) 4.22 - 5.81 (MIL/uL)   Hemoglobin 13.2  13.0 - 17.0 (g/dL)   HCT 78.2 (*) 95.6 - 52.0 (%)   MCV 92.5  78.0 - 100.0 (fL)   MCH  33.1  26.0 - 34.0 (pg)   MCHC 35.8  30.0 - 36.0 (g/dL)   RDW 21.3  08.6 - 57.8 (%)   Platelets 273  150 - 400 (K/uL)  BASIC METABOLIC PANEL      Component Value Range   Sodium 138  135 - 145 (mEq/L)   Potassium 4.3  3.5 - 5.1 (mEq/L)   Chloride 103  96 - 112 (mEq/L)   CO2 26  19 - 32 (mEq/L)   Glucose, Bld 146 (*) 70 - 99 (mg/dL)   BUN 11  6 - 23 (mg/dL)   Creatinine, Ser 4.69  0.50 - 1.35 (mg/dL)   Calcium 8.6  8.4 - 62.9 (mg/dL)   GFR calc non Af Amer >90  >90 (mL/min)   GFR calc Af Amer >90  >90 (mL/min)  CBC      Component Value Range   WBC 11.6 (*) 4.0 - 10.5 (K/uL)   RBC 3.96 (*) 4.22 - 5.81 (MIL/uL)   Hemoglobin 13.0  13.0 - 17.0 (g/dL)   HCT 52.8 (*) 41.3 - 52.0 (%)   MCV 92.9  78.0 - 100.0 (fL)   MCH 32.8  26.0 - 34.0 (pg)   MCHC 35.3  30.0 - 36.0 (g/dL)   RDW 16.1  09.6 - 04.5 (%)   Platelets 234  150 - 400 (K/uL)  BASIC METABOLIC PANEL      Component Value Range   Sodium 136  135 - 145 (mEq/L)   Potassium 3.8  3.5 - 5.1 (mEq/L)   Chloride 102  96 - 112 (mEq/L)   CO2 28  19 - 32 (mEq/L)   Glucose, Bld 105 (*) 70 - 99 (mg/dL)   BUN 11  6 - 23 (mg/dL)   Creatinine, Ser 4.09  0.50 - 1.35 (mg/dL)   Calcium 8.3 (*) 8.4 - 10.5 (mg/dL)   GFR calc non Af Amer >90  >90 (mL/min)   GFR calc Af Amer >90  >90 (mL/min)   Hospital Outpatient Visit on 09/25/2011  Component Date Value Range Status  . WBC (K/uL) 09/25/2011 6.3  4.0-10.5 Final  . RBC (MIL/uL) 09/25/2011 4.56  4.22-5.81 Final  . Hemoglobin (g/dL) 81/19/1478 29.5  62.1-30.8 Final  . HCT (%) 09/25/2011 41.9  39.0-52.0 Final  . MCV (fL) 09/25/2011 91.9  78.0-100.0 Final  . MCH (pg) 09/25/2011 32.5  26.0-34.0 Final  . MCHC (g/dL) 65/78/4696 29.5  28.4-13.2 Final  . RDW (%) 09/25/2011 13.0  11.5-15.5 Final  . Platelets (K/uL) 09/25/2011 281  150-400 Final  . Sodium (mEq/L) 09/25/2011 141  135-145 Final  . Potassium (mEq/L) 09/25/2011 3.5  3.5-5.1 Final  . Chloride (mEq/L) 09/25/2011 107  96-112 Final  . CO2  (mEq/L) 09/25/2011 25  19-32 Final  . Glucose, Bld (mg/dL) 44/11/270 93  53-66 Final  . BUN (mg/dL) 44/01/4741 12  5-95 Final  . Creatinine, Ser (mg/dL) 63/87/5643 3.29  5.18-8.41 Final  . Calcium (mg/dL) 66/04/3015 9.0  0.1-09.3 Final  . Total Protein (g/dL) 23/55/7322 6.6  0.2-5.4 Final  . Albumin (g/dL) 27/04/2375 3.9  2.8-3.1 Final  . AST (U/L) 09/25/2011 20  0-37 Final  . ALT (U/L) 09/25/2011 20  0-53 Final  . Alkaline Phosphatase (U/L) 09/25/2011 69  39-117 Final  . Total Bilirubin (mg/dL) 51/76/1607 0.5  3.7-1.0 Final  . GFR calc non Af Amer (mL/min) 09/25/2011 89* >90 Final  . GFR calc Af Amer (mL/min) 09/25/2011 >90  >90 Final   Comment:                                 The eGFR has been calculated                          using the CKD EPI equation.                          This calculation has not been                          validated in all clinical                          situations.                          eGFR's persistently                          <90 mL/min signify  possible Chronic Kidney Disease.  Marland Kitchen Prothrombin Time (seconds) 09/25/2011 13.7  11.6-15.2 Final  . INR  09/25/2011 1.03  0.00-1.49 Final  . aPTT (seconds) 09/25/2011 35  24-37 Final  . MRSA, PCR  09/25/2011 NEGATIVE  NEGATIVE Final  . Staphylococcus aureus  09/25/2011 NEGATIVE  NEGATIVE Final   Comment:                                 The Xpert SA Assay (FDA                          approved for NASAL specimens                          only), is one component of                          a comprehensive surveillance                          program.  It is not intended                          to diagnose infection nor to                          guide or monitor treatment.    X-Rays:Dg Hip Complete Left  09/25/2011  *RADIOLOGY REPORT*  Clinical Data: Left hip pain.  Preoperative for arthroplasty.  LEFT HIP - COMPLETE 2+ VIEW  Comparison: None.  Findings: Severe loss of  craniocaudad articular space in the left hip noted, compatible with prominent osteoarthritis.  Subcortical sclerosis is present.  Contrast the right hip demonstrates only mild loss of articular space.  No fracture is observed.  There is spurring of the left femoral head.  IMPRESSION:  1.  Prominent osteoarthritis of the left hip.  Mild osteoarthritis of the right hip.  Original Report Authenticated By: Dellia Cloud, M.D.   Dg Pelvis Portable  10/03/2011  *RADIOLOGY REPORT*  Clinical Data: Postop hip replacement  PORTABLE PELVIS  Comparison: Plain film 09/25/2011  Findings: Interval left hip total arthroplasty.  The femoral component and the acetabular component appears well seated without fracture.  Surgical drain in place at the left hip joint.  IMPRESSION: Left hip total arthroplasty without complication.  Original Report Authenticated By: Genevive Bi, M.D.   Dg Hip Portable 1 View Left  10/03/2011  *RADIOLOGY REPORT*  Clinical Data: Postop  PORTABLE LEFT HIP - 1 VIEW  Comparison: Plain film 09/25/2011  Findings: Lateral view of the left femur demonstrates a left hip total arthroplasty prosthetic.  No evidence of fracture.  IMPRESSION: No evidence of complication following left hip arthroplasty.  Original Report Authenticated By: Genevive Bi, M.D.    EKG:No orders found for this or any previous visit.   Hospital Course: Patient was admitted to [facility] and taken to the OR and underwent the above state procedure without complications.  Patient tolerated the procedure well and was later transferred to the recovery room and then to the orthopaedic floor for postoperative care.  They were given PO and IV analgesics for pain control following their surgery.  They were given 24 hours of postoperative antibiotics and started on DVT  prophylaxis.   PT and OT were ordered for total joint protocol.  Discharge planning consulted to help with postop disposition and equipment needs.   Patient had a decent night on the evening of surgery and started to get up with therapy on day one. Hemovac drain was pulled without difficulty. Got up with PT and did very well walking over 200 feet the first day.  He continued to progress with therapy into day two.  Dressing was changed on day two and the incision was healing well with no signs of infection.  He did very well and wanted to go home later that same day.  Arrangements were made and he was discharged home later that day.  Discharge Medications: Prior to Admission medications   Medication Sig Start Date End Date Taking? Authorizing Provider  methocarbamol (ROBAXIN) 500 MG tablet Take 1 tablet (500 mg total) by mouth every 6 (six) hours as needed. 10/05/11 10/15/11  Shakeem Stern, PA  oxyCODONE (OXY IR/ROXICODONE) 5 MG immediate release tablet Take 1-2 tablets (5-10 mg total) by mouth every 4 (four) hours as needed for pain. 10/05/11 10/15/11  Bralen Wiltgen Julien Girt, PA  rivaroxaban (XARELTO) 10 MG TABS tablet Take 1 tablet (10 mg total) by mouth daily with breakfast. 10/05/11   Jacody Beneke Julien Girt, PA    Diet: regular  Activity:PWB No bending hip over 90 degrees- A "L" Angle Do not cross legs Do not let foot roll inward  When turning these patients a pillow should be placed between the patient's legs to prevent crossing.  Patients should have the affected knee fully extended when trying to sit or stand from all surfaces to prevent excessive hip flexion.  When ambulating and turning toward the affected side the affected leg should have the toes turned out prior to moving the walker and the rest of patient's body as to prevent internal rotation/ turning in of the leg.  Abduction pillows are the most effective way to prevent a patient from not crossing legs or turning toes in at rest. If an abduction pillow is not ordered placing a regular pillow length wise between the patient's legs is also an effective reminder.  It is  imperative that these precautions be maintained so that the surgical hip does not dislocate.    Follow-up:in 2 weeks  Disposition: Home  Discharge Orders    Future Orders Please Complete By Expires   Diet - low sodium heart healthy      Call MD / Call 911      Comments:   If you experience chest pain or shortness of breath, CALL 911 and be transported to the hospital emergency room.  If you develope a fever above 101 F, pus (white drainage) or increased drainage or redness at the wound, or calf pain, call your surgeon's office.   Constipation Prevention      Comments:   Drink plenty of fluids.  Prune juice may be helpful.  You may use a stool softener, such as Colace (over the counter) 100 mg twice a day.  Use MiraLax (over the counter) for constipation as needed.   Increase activity slowly as tolerated      Weight Bearing as taught in Physical Therapy      Comments:   Use a walker or crutches as instructed.   Discharge instructions      Comments:   Pick up stool softner and laxative for home. Do not submerge incision under water. May shower.    Driving restrictions  Comments:   No driving   Lifting restrictions      Comments:   No lifting   Follow the hip precautions as taught in Physical Therapy      Change dressing      Comments:   You may change your dressing daily with sterile 4 x 4 inch gauze dressing and paper tape.   TED hose      Comments:   Use stockings (TED hose) for 3 weeks on both leg(s).  You may remove them at night for sleeping.     Discharge Medication List as of 10/05/2011 10:35 AM    START taking these medications   Details  methocarbamol (ROBAXIN) 500 MG tablet Take 1 tablet (500 mg total) by mouth every 6 (six) hours as needed., Starting 10/05/2011, Until Mon 10/15/11, Print    oxyCODONE (OXY IR/ROXICODONE) 5 MG immediate release tablet Take 1-2 tablets (5-10 mg total) by mouth every 4 (four) hours as needed for pain., Starting 10/05/2011,  Until Mon 10/15/11, Print    rivaroxaban (XARELTO) 10 MG TABS tablet Take 1 tablet (10 mg total) by mouth daily with breakfast., Starting 10/05/2011, Until Discontinued, Print      STOP taking these medications     ibuprofen (ADVIL,MOTRIN) 200 MG tablet        Follow-up Information    Follow up with ALUISIO,FRANK V. Make an appointment in 2 weeks.   Contact information:   [facility] [address] [place] [address] [phone]          Signed: Patrica Duel 10/07/2011, 7:32 PM

## 2012-04-13 IMAGING — CR DG HIP 1V PORT*L*
1 series · 1 of 1 positions shown · non-contrast
Comparison: Plain film 09/25/2011

CLINICAL DATA: Postop

PORTABLE LEFT HIP - 1 VIEW

[view not recorded]
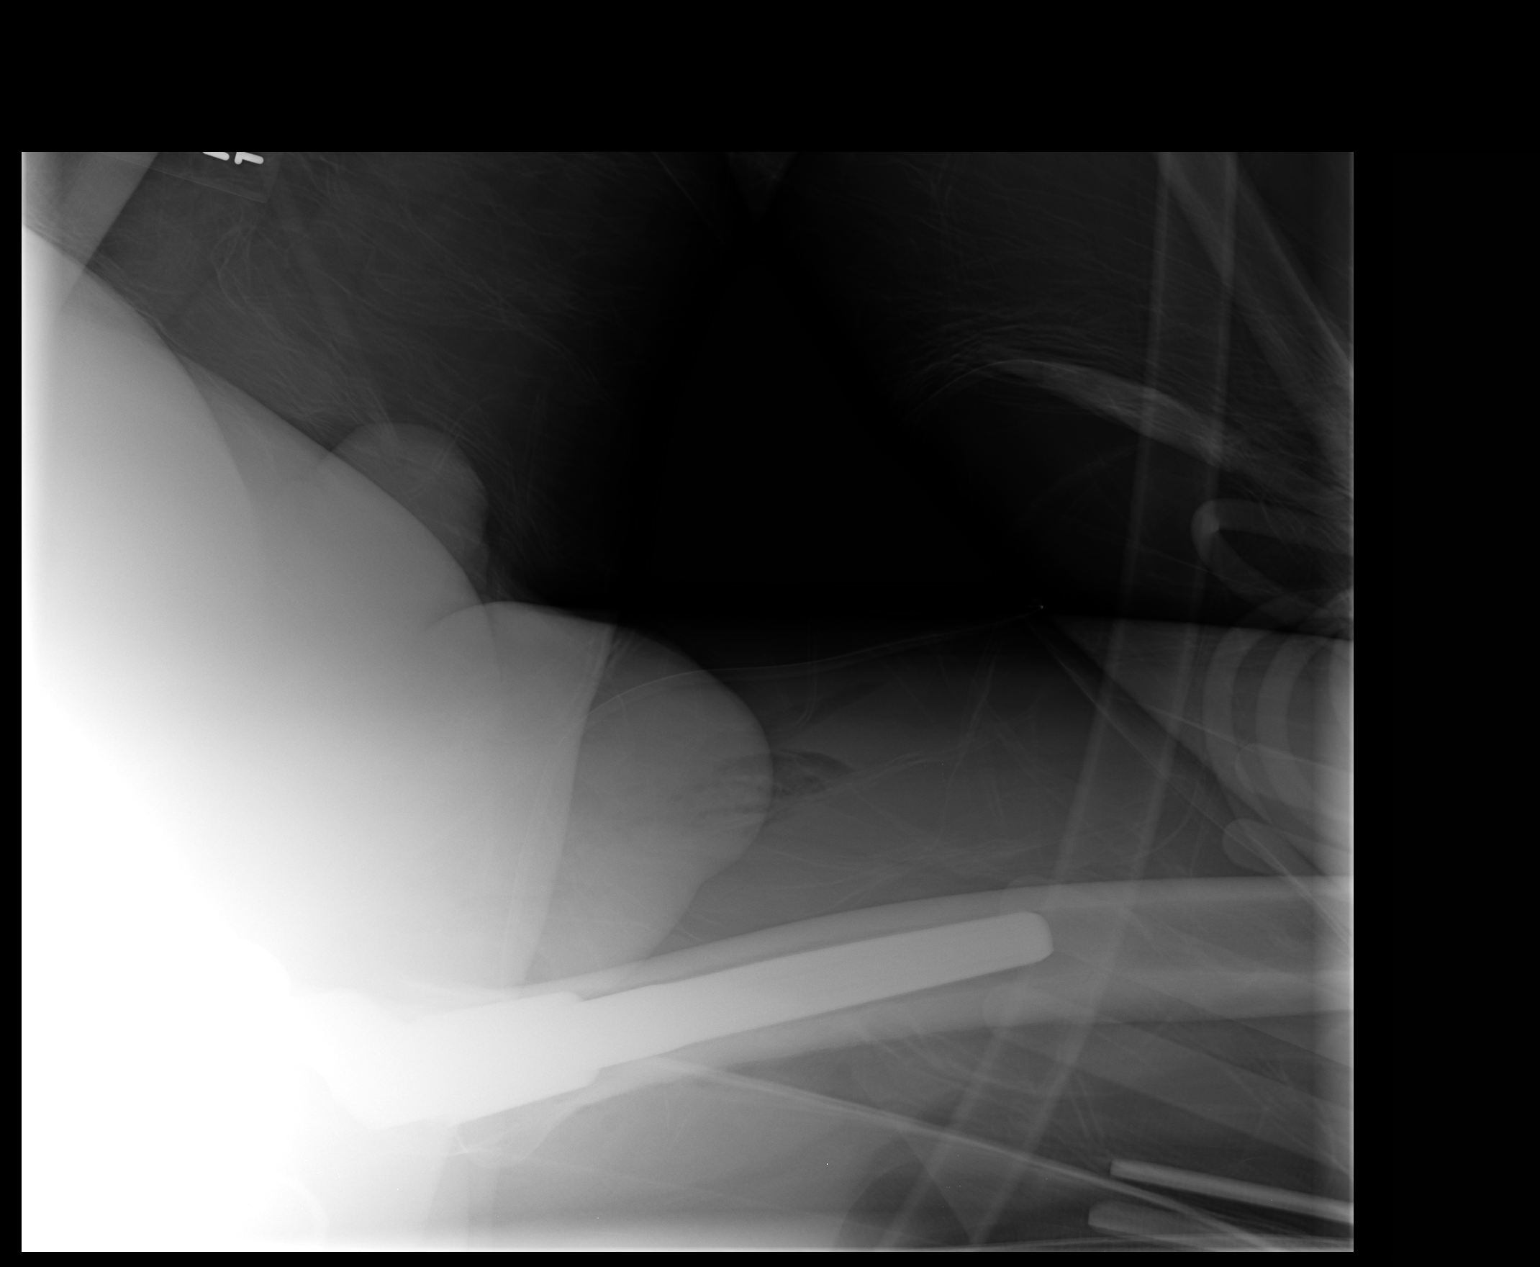

[1 of 1 positions shown; findings below may reference images not displayed]

FINDINGS: Lateral view of the left femur demonstrates a left hip
total arthroplasty prosthetic.  No evidence of fracture.
IMPRESSION: No evidence of complication following left hip arthroplasty.

## 2013-12-29 ENCOUNTER — Other Ambulatory Visit: Payer: Self-pay | Admitting: Surgical

## 2017-04-29 DIAGNOSIS — E559 Vitamin D deficiency, unspecified: Secondary | ICD-10-CM | POA: Diagnosis not present

## 2017-04-29 DIAGNOSIS — N529 Male erectile dysfunction, unspecified: Secondary | ICD-10-CM | POA: Diagnosis not present

## 2017-04-29 DIAGNOSIS — E7800 Pure hypercholesterolemia, unspecified: Secondary | ICD-10-CM | POA: Diagnosis not present

## 2017-04-29 DIAGNOSIS — Z0000 Encounter for general adult medical examination without abnormal findings: Secondary | ICD-10-CM | POA: Diagnosis not present

## 2017-04-29 DIAGNOSIS — Z125 Encounter for screening for malignant neoplasm of prostate: Secondary | ICD-10-CM | POA: Diagnosis not present

## 2017-05-29 DIAGNOSIS — B029 Zoster without complications: Secondary | ICD-10-CM | POA: Diagnosis not present

## 2017-06-08 DIAGNOSIS — Z23 Encounter for immunization: Secondary | ICD-10-CM | POA: Diagnosis not present

## 2017-11-05 HISTORY — PX: COLONOSCOPY: SHX174

## 2018-06-10 DIAGNOSIS — Z1211 Encounter for screening for malignant neoplasm of colon: Secondary | ICD-10-CM | POA: Diagnosis not present

## 2018-06-10 DIAGNOSIS — Z125 Encounter for screening for malignant neoplasm of prostate: Secondary | ICD-10-CM | POA: Diagnosis not present

## 2018-06-10 DIAGNOSIS — Z1389 Encounter for screening for other disorder: Secondary | ICD-10-CM | POA: Diagnosis not present

## 2018-06-10 DIAGNOSIS — Z23 Encounter for immunization: Secondary | ICD-10-CM | POA: Diagnosis not present

## 2018-06-10 DIAGNOSIS — N529 Male erectile dysfunction, unspecified: Secondary | ICD-10-CM | POA: Diagnosis not present

## 2018-06-10 DIAGNOSIS — M62830 Muscle spasm of back: Secondary | ICD-10-CM | POA: Diagnosis not present

## 2018-06-10 DIAGNOSIS — J301 Allergic rhinitis due to pollen: Secondary | ICD-10-CM | POA: Diagnosis not present

## 2018-06-10 DIAGNOSIS — E7800 Pure hypercholesterolemia, unspecified: Secondary | ICD-10-CM | POA: Diagnosis not present

## 2018-06-10 DIAGNOSIS — E559 Vitamin D deficiency, unspecified: Secondary | ICD-10-CM | POA: Diagnosis not present

## 2018-06-10 DIAGNOSIS — Z0000 Encounter for general adult medical examination without abnormal findings: Secondary | ICD-10-CM | POA: Diagnosis not present

## 2018-08-12 DIAGNOSIS — Z980 Intestinal bypass and anastomosis status: Secondary | ICD-10-CM | POA: Diagnosis not present

## 2018-08-12 DIAGNOSIS — Z86010 Personal history of colonic polyps: Secondary | ICD-10-CM | POA: Diagnosis not present

## 2018-08-12 DIAGNOSIS — K644 Residual hemorrhoidal skin tags: Secondary | ICD-10-CM | POA: Diagnosis not present

## 2018-08-12 DIAGNOSIS — K648 Other hemorrhoids: Secondary | ICD-10-CM | POA: Diagnosis not present

## 2018-08-12 DIAGNOSIS — K635 Polyp of colon: Secondary | ICD-10-CM | POA: Diagnosis not present

## 2018-08-15 DIAGNOSIS — Z980 Intestinal bypass and anastomosis status: Secondary | ICD-10-CM | POA: Diagnosis not present

## 2018-08-15 DIAGNOSIS — K635 Polyp of colon: Secondary | ICD-10-CM | POA: Diagnosis not present

## 2018-09-01 DIAGNOSIS — Z23 Encounter for immunization: Secondary | ICD-10-CM | POA: Diagnosis not present

## 2019-06-10 DIAGNOSIS — Z1389 Encounter for screening for other disorder: Secondary | ICD-10-CM | POA: Diagnosis not present

## 2019-06-10 DIAGNOSIS — N529 Male erectile dysfunction, unspecified: Secondary | ICD-10-CM | POA: Diagnosis not present

## 2019-06-10 DIAGNOSIS — M62830 Muscle spasm of back: Secondary | ICD-10-CM | POA: Diagnosis not present

## 2019-06-10 DIAGNOSIS — E7800 Pure hypercholesterolemia, unspecified: Secondary | ICD-10-CM | POA: Diagnosis not present

## 2019-06-10 DIAGNOSIS — Z23 Encounter for immunization: Secondary | ICD-10-CM | POA: Diagnosis not present

## 2019-06-10 DIAGNOSIS — Z0001 Encounter for general adult medical examination with abnormal findings: Secondary | ICD-10-CM | POA: Diagnosis not present

## 2019-06-10 DIAGNOSIS — Z125 Encounter for screening for malignant neoplasm of prostate: Secondary | ICD-10-CM | POA: Diagnosis not present

## 2019-06-10 DIAGNOSIS — E559 Vitamin D deficiency, unspecified: Secondary | ICD-10-CM | POA: Diagnosis not present

## 2019-06-10 DIAGNOSIS — J301 Allergic rhinitis due to pollen: Secondary | ICD-10-CM | POA: Diagnosis not present

## 2019-08-09 DIAGNOSIS — Z23 Encounter for immunization: Secondary | ICD-10-CM | POA: Diagnosis not present

## 2019-09-18 DIAGNOSIS — C44319 Basal cell carcinoma of skin of other parts of face: Secondary | ICD-10-CM | POA: Diagnosis not present

## 2019-09-18 DIAGNOSIS — D485 Neoplasm of uncertain behavior of skin: Secondary | ICD-10-CM | POA: Diagnosis not present

## 2019-09-18 DIAGNOSIS — L821 Other seborrheic keratosis: Secondary | ICD-10-CM | POA: Diagnosis not present

## 2019-09-18 DIAGNOSIS — D225 Melanocytic nevi of trunk: Secondary | ICD-10-CM | POA: Diagnosis not present

## 2019-09-18 DIAGNOSIS — L570 Actinic keratosis: Secondary | ICD-10-CM | POA: Diagnosis not present

## 2019-09-18 DIAGNOSIS — L918 Other hypertrophic disorders of the skin: Secondary | ICD-10-CM | POA: Diagnosis not present

## 2019-09-25 DIAGNOSIS — E7800 Pure hypercholesterolemia, unspecified: Secondary | ICD-10-CM | POA: Diagnosis not present

## 2019-12-18 DIAGNOSIS — Z23 Encounter for immunization: Secondary | ICD-10-CM | POA: Diagnosis not present

## 2020-01-01 DIAGNOSIS — B079 Viral wart, unspecified: Secondary | ICD-10-CM | POA: Diagnosis not present

## 2020-01-01 DIAGNOSIS — D485 Neoplasm of uncertain behavior of skin: Secondary | ICD-10-CM | POA: Diagnosis not present

## 2020-01-01 DIAGNOSIS — L570 Actinic keratosis: Secondary | ICD-10-CM | POA: Diagnosis not present

## 2020-01-01 DIAGNOSIS — L821 Other seborrheic keratosis: Secondary | ICD-10-CM | POA: Diagnosis not present

## 2020-01-01 DIAGNOSIS — C44319 Basal cell carcinoma of skin of other parts of face: Secondary | ICD-10-CM | POA: Diagnosis not present

## 2020-01-01 DIAGNOSIS — D2262 Melanocytic nevi of left upper limb, including shoulder: Secondary | ICD-10-CM | POA: Diagnosis not present

## 2020-01-01 DIAGNOSIS — Z85828 Personal history of other malignant neoplasm of skin: Secondary | ICD-10-CM | POA: Diagnosis not present

## 2020-01-01 DIAGNOSIS — D225 Melanocytic nevi of trunk: Secondary | ICD-10-CM | POA: Diagnosis not present

## 2020-01-15 DIAGNOSIS — Z23 Encounter for immunization: Secondary | ICD-10-CM | POA: Diagnosis not present

## 2020-01-26 DIAGNOSIS — C44319 Basal cell carcinoma of skin of other parts of face: Secondary | ICD-10-CM | POA: Diagnosis not present

## 2020-01-26 DIAGNOSIS — Z85828 Personal history of other malignant neoplasm of skin: Secondary | ICD-10-CM | POA: Diagnosis not present

## 2020-01-26 DIAGNOSIS — L570 Actinic keratosis: Secondary | ICD-10-CM | POA: Diagnosis not present

## 2020-06-06 DIAGNOSIS — H6590 Unspecified nonsuppurative otitis media, unspecified ear: Secondary | ICD-10-CM | POA: Diagnosis not present

## 2020-06-06 DIAGNOSIS — J22 Unspecified acute lower respiratory infection: Secondary | ICD-10-CM | POA: Diagnosis not present

## 2020-06-06 DIAGNOSIS — J019 Acute sinusitis, unspecified: Secondary | ICD-10-CM | POA: Diagnosis not present

## 2020-06-06 DIAGNOSIS — Z03818 Encounter for observation for suspected exposure to other biological agents ruled out: Secondary | ICD-10-CM | POA: Diagnosis not present

## 2020-06-16 DIAGNOSIS — J301 Allergic rhinitis due to pollen: Secondary | ICD-10-CM | POA: Diagnosis not present

## 2020-06-16 DIAGNOSIS — Z1211 Encounter for screening for malignant neoplasm of colon: Secondary | ICD-10-CM | POA: Diagnosis not present

## 2020-06-16 DIAGNOSIS — E559 Vitamin D deficiency, unspecified: Secondary | ICD-10-CM | POA: Diagnosis not present

## 2020-06-16 DIAGNOSIS — Z125 Encounter for screening for malignant neoplasm of prostate: Secondary | ICD-10-CM | POA: Diagnosis not present

## 2020-06-16 DIAGNOSIS — R030 Elevated blood-pressure reading, without diagnosis of hypertension: Secondary | ICD-10-CM | POA: Diagnosis not present

## 2020-06-16 DIAGNOSIS — E7800 Pure hypercholesterolemia, unspecified: Secondary | ICD-10-CM | POA: Diagnosis not present

## 2020-06-16 DIAGNOSIS — Z0001 Encounter for general adult medical examination with abnormal findings: Secondary | ICD-10-CM | POA: Diagnosis not present

## 2020-06-16 DIAGNOSIS — N529 Male erectile dysfunction, unspecified: Secondary | ICD-10-CM | POA: Diagnosis not present

## 2020-07-15 DIAGNOSIS — E7800 Pure hypercholesterolemia, unspecified: Secondary | ICD-10-CM | POA: Diagnosis not present

## 2020-07-15 DIAGNOSIS — R030 Elevated blood-pressure reading, without diagnosis of hypertension: Secondary | ICD-10-CM | POA: Diagnosis not present

## 2020-08-05 DIAGNOSIS — Z23 Encounter for immunization: Secondary | ICD-10-CM | POA: Diagnosis not present

## 2020-10-14 DIAGNOSIS — Z85828 Personal history of other malignant neoplasm of skin: Secondary | ICD-10-CM | POA: Diagnosis not present

## 2020-10-14 DIAGNOSIS — D225 Melanocytic nevi of trunk: Secondary | ICD-10-CM | POA: Diagnosis not present

## 2020-10-14 DIAGNOSIS — L820 Inflamed seborrheic keratosis: Secondary | ICD-10-CM | POA: Diagnosis not present

## 2020-10-14 DIAGNOSIS — C44319 Basal cell carcinoma of skin of other parts of face: Secondary | ICD-10-CM | POA: Diagnosis not present

## 2020-10-14 DIAGNOSIS — L821 Other seborrheic keratosis: Secondary | ICD-10-CM | POA: Diagnosis not present

## 2020-10-25 DIAGNOSIS — H903 Sensorineural hearing loss, bilateral: Secondary | ICD-10-CM | POA: Diagnosis not present

## 2020-10-25 DIAGNOSIS — H938X3 Other specified disorders of ear, bilateral: Secondary | ICD-10-CM | POA: Diagnosis not present

## 2020-11-08 DIAGNOSIS — Z23 Encounter for immunization: Secondary | ICD-10-CM | POA: Diagnosis not present

## 2021-03-21 DIAGNOSIS — H1031 Unspecified acute conjunctivitis, right eye: Secondary | ICD-10-CM | POA: Diagnosis not present

## 2021-06-16 DIAGNOSIS — G5601 Carpal tunnel syndrome, right upper limb: Secondary | ICD-10-CM | POA: Diagnosis not present

## 2021-06-16 DIAGNOSIS — G629 Polyneuropathy, unspecified: Secondary | ICD-10-CM | POA: Diagnosis not present

## 2021-07-24 DIAGNOSIS — N529 Male erectile dysfunction, unspecified: Secondary | ICD-10-CM | POA: Diagnosis not present

## 2021-07-24 DIAGNOSIS — G5601 Carpal tunnel syndrome, right upper limb: Secondary | ICD-10-CM | POA: Diagnosis not present

## 2021-07-24 DIAGNOSIS — E559 Vitamin D deficiency, unspecified: Secondary | ICD-10-CM | POA: Diagnosis not present

## 2021-07-24 DIAGNOSIS — Z125 Encounter for screening for malignant neoplasm of prostate: Secondary | ICD-10-CM | POA: Diagnosis not present

## 2021-07-24 DIAGNOSIS — J301 Allergic rhinitis due to pollen: Secondary | ICD-10-CM | POA: Diagnosis not present

## 2021-07-24 DIAGNOSIS — R030 Elevated blood-pressure reading, without diagnosis of hypertension: Secondary | ICD-10-CM | POA: Diagnosis not present

## 2021-07-24 DIAGNOSIS — R519 Headache, unspecified: Secondary | ICD-10-CM | POA: Diagnosis not present

## 2021-07-24 DIAGNOSIS — Z0000 Encounter for general adult medical examination without abnormal findings: Secondary | ICD-10-CM | POA: Diagnosis not present

## 2021-07-24 DIAGNOSIS — E7800 Pure hypercholesterolemia, unspecified: Secondary | ICD-10-CM | POA: Diagnosis not present

## 2021-07-24 DIAGNOSIS — Z0001 Encounter for general adult medical examination with abnormal findings: Secondary | ICD-10-CM | POA: Diagnosis not present

## 2021-07-24 DIAGNOSIS — Z1211 Encounter for screening for malignant neoplasm of colon: Secondary | ICD-10-CM | POA: Diagnosis not present

## 2021-08-02 DIAGNOSIS — I10 Essential (primary) hypertension: Secondary | ICD-10-CM | POA: Diagnosis not present

## 2021-08-02 DIAGNOSIS — G5601 Carpal tunnel syndrome, right upper limb: Secondary | ICD-10-CM | POA: Diagnosis not present

## 2021-08-02 DIAGNOSIS — Z1211 Encounter for screening for malignant neoplasm of colon: Secondary | ICD-10-CM | POA: Diagnosis not present

## 2021-08-02 DIAGNOSIS — Z23 Encounter for immunization: Secondary | ICD-10-CM | POA: Diagnosis not present

## 2021-08-09 DIAGNOSIS — G5601 Carpal tunnel syndrome, right upper limb: Secondary | ICD-10-CM | POA: Diagnosis not present

## 2021-08-09 DIAGNOSIS — G5603 Carpal tunnel syndrome, bilateral upper limbs: Secondary | ICD-10-CM | POA: Diagnosis not present

## 2021-08-17 DIAGNOSIS — M5412 Radiculopathy, cervical region: Secondary | ICD-10-CM | POA: Diagnosis not present

## 2021-08-17 DIAGNOSIS — G5601 Carpal tunnel syndrome, right upper limb: Secondary | ICD-10-CM | POA: Diagnosis not present

## 2021-08-28 NOTE — Progress Notes (Signed)
Spoke with ashley at  dr spear's office and made aware pt called x 4 with no call back, ashley to call pt.

## 2021-08-30 ENCOUNTER — Other Ambulatory Visit: Payer: Self-pay

## 2021-08-30 ENCOUNTER — Encounter (HOSPITAL_BASED_OUTPATIENT_CLINIC_OR_DEPARTMENT_OTHER): Payer: Self-pay | Admitting: Orthopedic Surgery

## 2021-08-30 NOTE — Anesthesia Preprocedure Evaluation (Addendum)
Anesthesia Evaluation  Patient identified by MRN, date of birth, ID band Patient awake    Reviewed: Allergy & Precautions, NPO status , Patient's Chart, lab work & pertinent test results  History of Anesthesia Complications Negative for: history of anesthetic complications  Airway Mallampati: I  TM Distance: >3 FB Neck ROM: Full    Dental  (+) Edentulous Upper, Dental Advisory Given   Pulmonary neg pulmonary ROS, former smoker,    Pulmonary exam normal        Cardiovascular hypertension, Pt. on medications Normal cardiovascular exam     Neuro/Psych negative neurological ROS     GI/Hepatic negative GI ROS, Neg liver ROS,   Endo/Other  negative endocrine ROS  Renal/GU negative Renal ROS     Musculoskeletal negative musculoskeletal ROS (+)   Abdominal   Peds  Hematology negative hematology ROS (+)   Anesthesia Other Findings   Reproductive/Obstetrics                            Anesthesia Physical Anesthesia Plan  ASA: 2  Anesthesia Plan: MAC   Post-op Pain Management:    Induction:   PONV Risk Score and Plan: 2 and Ondansetron and Dexamethasone  Airway Management Planned: Natural Airway, Nasal Cannula and Simple Face Mask  Additional Equipment:   Intra-op Plan:   Post-operative Plan:   Informed Consent: I have reviewed the patients History and Physical, chart, labs and discussed the procedure including the risks, benefits and alternatives for the proposed anesthesia with the patient or authorized representative who has indicated his/her understanding and acceptance.       Plan Discussed with: Anesthesiologist and CRNA  Anesthesia Plan Comments:        Anesthesia Quick Evaluation

## 2021-08-30 NOTE — H&P (Signed)
Preoperative History & Physical Exam  Surgeon: George Holmes, MD  Diagnosis: Right Carpal Tunnel Syndrome  Planned Procedure: Procedure(s) (LRB): CARPAL TUNNEL RELEASE (Right)  History of Present Illness:   Patient is a 70 y.o. male with symptoms consistent with  Right Carpal Tunnel Syndrome who presents for surgical intervention. The risks, benefits and alternatives of surgical intervention were discussed and informed consent was obtained prior to surgery.  Past Medical History:  Past Medical History:  Diagnosis Date   Carpal tunnel syndrome on right    Family history of adverse reaction to anesthesia    mother--- slow to wake   Hypertension    followed by pcp--- pt stated has nuclear stress test approx 2012 told normal   OA (osteoarthritis)    Wears dentures    upper    Past Surgical History:  Past Surgical History:  Procedure Laterality Date   COLONOSCOPY  2019   HAND TENDON SURGERY Left 1998   approx   TOTAL HIP ARTHROPLASTY  10/03/2011   Procedure: TOTAL HIP ARTHROPLASTY;  Surgeon: George Roberts;  Location: [facility];  Service: Orthopedics;  Laterality: Left;    Medications:  Prior to Admission medications   Medication Sig Start Date End Date Taking? Authorizing Provider  amLODipine (NORVASC) 5 MG tablet Take 5 mg by mouth daily.   Yes [provider]  atorvastatin (LIPITOR) 10 MG tablet Take 10 mg by mouth daily.   Yes [provider]  Multiple Vitamin (ONE-A-DAY MENS PO) Take 1 tablet by mouth daily.   Yes [provider]  naproxen sodium (ALEVE) 220 MG tablet Take 220 mg by mouth as needed.   Yes [provider]  valsartan (DIOVAN) 80 MG tablet Take 80 mg by mouth daily.   Yes [provider]    Allergies:  Patient has no known allergies.  Review of Systems: Negative except per HPI.  Physical Exam: Alert and oriented, NAD Head and neck: no masses, normal alignment CV: pulse intact Pulm: no increased work of  breathing, respirations even and unlabored Abdomen: non-distended Extremities: extremities warm and well perfused  LABS: No results found for this or any previous visit (from the past 2160 hour(s)).   Complete History and Physical exam available in the office notes  George Roberts

## 2021-08-30 NOTE — Progress Notes (Signed)
Spoke w/ via phone for pre-op interview---pt Lab needs dos----  [employer] and [employer] results------ no COVID test -----patient states asymptomatic no test needed Arrive at ------- 0815 on 08-31-2021 NPO after MN NO Solid Food.  Clear liquids from MN until--- 0715 Med rec completed Medications to take morning of surgery ----- norvasc Diabetic medication ----- n/a Patient instructed no nail polish to be worn day of surgery Patient instructed to bring photo id and insurance card day of surgery Patient aware to have Driver (ride ) / caregiver  for 24 hours after surgery --wife, George Roberts Patient Special Instructions ----- n/a Pre-Op special Istructions ----- n/a Patient verbalized understanding of instructions that were given at this phone interview. Patient denies shortness of breath, chest pain, fever, cough at this phone interview.

## 2021-08-31 ENCOUNTER — Encounter (HOSPITAL_BASED_OUTPATIENT_CLINIC_OR_DEPARTMENT_OTHER)
Admission: RE | Disposition: A | Discharge status: Home / Self Care | Payer: Self-pay | Source: Home / Self Care | Attending: Orthopedic Surgery

## 2021-08-31 ENCOUNTER — Encounter (HOSPITAL_BASED_OUTPATIENT_CLINIC_OR_DEPARTMENT_OTHER): Payer: Self-pay | Admitting: Orthopedic Surgery

## 2021-08-31 ENCOUNTER — Ambulatory Visit (HOSPITAL_BASED_OUTPATIENT_CLINIC_OR_DEPARTMENT_OTHER): Payer: Medicare Other | Admitting: Anesthesiology

## 2021-08-31 ENCOUNTER — Ambulatory Visit (HOSPITAL_BASED_OUTPATIENT_CLINIC_OR_DEPARTMENT_OTHER)
Admission: RE | Admit: 2021-08-31 | Discharge: 2021-08-31 | Disposition: A | Discharge status: Home / Self Care | Payer: Medicare Other | Attending: Orthopedic Surgery | Admitting: Orthopedic Surgery

## 2021-08-31 DIAGNOSIS — G5601 Carpal tunnel syndrome, right upper limb: Secondary | ICD-10-CM | POA: Diagnosis not present

## 2021-08-31 DIAGNOSIS — I10 Essential (primary) hypertension: Secondary | ICD-10-CM | POA: Diagnosis not present

## 2021-08-31 DIAGNOSIS — S82852A Displaced trimalleolar fracture of left lower leg, initial encounter for closed fracture: Secondary | ICD-10-CM | POA: Diagnosis not present

## 2021-08-31 DIAGNOSIS — S9305XA Dislocation of left ankle joint, initial encounter: Secondary | ICD-10-CM | POA: Diagnosis not present

## 2021-08-31 DIAGNOSIS — Z87891 Personal history of nicotine dependence: Secondary | ICD-10-CM | POA: Diagnosis not present

## 2021-08-31 HISTORY — DX: Family history of other specified conditions: Z84.89

## 2021-08-31 HISTORY — DX: Essential (primary) hypertension: I10

## 2021-08-31 HISTORY — DX: Carpal tunnel syndrome, right upper limb: G56.01

## 2021-08-31 HISTORY — PX: CARPAL TUNNEL RELEASE: SHX101

## 2021-08-31 HISTORY — DX: Unspecified osteoarthritis, unspecified site: M19.90

## 2021-08-31 HISTORY — DX: Presence of dental prosthetic device (complete) (partial): Z97.2

## 2021-08-31 LAB — POCT I-STAT, CHEM 8
BUN: 20 mg/dL (ref 8–23)
Calcium, Ion: 1.16 mmol/L (ref 1.15–1.40)
Chloride: 105 mmol/L (ref 98–111)
Creatinine, Ser: 0.8 mg/dL (ref 0.61–1.24)
Glucose, Bld: 112 mg/dL — ABNORMAL HIGH (ref 70–99)
HCT: 45 % (ref 39.0–52.0)
Hemoglobin: 15.3 g/dL (ref 13.0–17.0)
Potassium: 4.5 mmol/L (ref 3.5–5.1)
Sodium: 143 mmol/L (ref 135–145)
TCO2: 27 mmol/L (ref 22–32)

## 2021-08-31 SURGERY — CARPAL TUNNEL RELEASE
Anesthesia: Monitor Anesthesia Care | Site: Hand | Laterality: Right

## 2021-08-31 MED ORDER — PROPOFOL 10 MG/ML IV BOLUS
INTRAVENOUS | Status: DC | PRN
  Administered 2021-08-31: 50 mg via INTRAVENOUS

## 2021-08-31 MED ORDER — ACETAMINOPHEN 500 MG PO TABS
1000.0000 mg | ORAL_TABLET | Freq: Once | ORAL | Status: AC
  Administered 2021-08-31: 1000 mg via ORAL

## 2021-08-31 MED ORDER — BUPIVACAINE HCL (PF) 0.5 % IJ SOLN
INTRAMUSCULAR | Status: DC | PRN
  Administered 2021-08-31: 5 mL

## 2021-08-31 MED ORDER — CELECOXIB 200 MG PO CAPS
ORAL_CAPSULE | ORAL | Status: AC
  Filled 2021-08-31: qty 1

## 2021-08-31 MED ORDER — LACTATED RINGERS IV SOLN: INTRAVENOUS | Status: DC

## 2021-08-31 MED ORDER — ACETAMINOPHEN 500 MG PO TABS
ORAL_TABLET | ORAL | Status: AC
  Filled 2021-08-31: qty 2

## 2021-08-31 MED ORDER — CELECOXIB 200 MG PO CAPS
200.0000 mg | ORAL_CAPSULE | Freq: Once | ORAL | Status: AC
  Administered 2021-08-31: 200 mg via ORAL

## 2021-08-31 MED ORDER — LIDOCAINE HCL (PF) 1 % IJ SOLN
INTRAMUSCULAR | Status: DC | PRN
  Administered 2021-08-31: 5 mL

## 2021-08-31 MED ORDER — PROPOFOL 500 MG/50ML IV EMUL
INTRAVENOUS | Status: DC | PRN
  Administered 2021-08-31: 100 ug/kg/min via INTRAVENOUS

## 2021-08-31 MED ORDER — HYDROCODONE-ACETAMINOPHEN 5-325 MG PO TABS: 1.0000 | ORAL_TABLET | Freq: Four times a day (QID) | ORAL | 0 refills | Status: AC | PRN

## 2021-08-31 MED ORDER — CEFAZOLIN SODIUM-DEXTROSE 2-4 GM/100ML-% IV SOLN
INTRAVENOUS | Status: AC
  Filled 2021-08-31: qty 100

## 2021-08-31 MED ORDER — CEFAZOLIN SODIUM-DEXTROSE 2-4 GM/100ML-% IV SOLN
2.0000 g | INTRAVENOUS | Status: AC
  Administered 2021-08-31: 2 g via INTRAVENOUS

## 2021-08-31 SURGICAL SUPPLY — 33 items
BLADE SURG 15 STRL LF DISP TIS (BLADE) ×1 IMPLANT
BLADE SURG 15 STRL SS (BLADE) ×2
BNDG CMPR 9X4 STRL LF SNTH (GAUZE/BANDAGES/DRESSINGS) ×1
BNDG ELASTIC 4X5.8 VLCR STR LF (GAUZE/BANDAGES/DRESSINGS) ×2 IMPLANT
BNDG ESMARK 4X9 LF (GAUZE/BANDAGES/DRESSINGS) ×2 IMPLANT
BNDG GAUZE ELAST 4 BULKY (GAUZE/BANDAGES/DRESSINGS) ×1 IMPLANT
COVER BACK TABLE 60X90IN (DRAPES) ×2 IMPLANT
CUFF TOURN SGL QUICK 18 (TOURNIQUET CUFF) ×2 IMPLANT
CUFF TOURN SGL QUICK 24 (TOURNIQUET CUFF)
CUFF TRNQT CYL 24X4X16.5-23 (TOURNIQUET CUFF) IMPLANT
DRAPE EXTREMITY T 121X128X90 (DISPOSABLE) ×2 IMPLANT
GAUZE 4X4 16PLY ~~LOC~~+RFID DBL (SPONGE) ×2 IMPLANT
GAUZE SPONGE 4X4 12PLY STRL (GAUZE/BANDAGES/DRESSINGS) ×2 IMPLANT
GAUZE SPONGE 4X4 12PLY STRL LF (GAUZE/BANDAGES/DRESSINGS) ×1 IMPLANT
GAUZE XEROFORM 1X8 LF (GAUZE/BANDAGES/DRESSINGS) ×2 IMPLANT
GLOVE SURG ENC MOIS LTX SZ7.5 (GLOVE) ×2 IMPLANT
GOWN STRL REUS W/ TWL LRG LVL3 (GOWN DISPOSABLE) ×1 IMPLANT
GOWN STRL REUS W/TWL LRG LVL3 (GOWN DISPOSABLE) ×2
HIBICLENS CHG 4% 4OZ (MISCELLANEOUS) ×2 IMPLANT
KIT TURNOVER CYSTO (KITS) ×2 IMPLANT
KNIFE CARPAL TUNNEL (BLADE) ×2 IMPLANT
NDL HYPO 25X1 1.5 SAFETY (NEEDLE) ×1 IMPLANT
NEEDLE HYPO 25X1 1.5 SAFETY (NEEDLE) ×2 IMPLANT
NS IRRIG 500ML POUR BTL (IV SOLUTION) ×2 IMPLANT
PACK BASIN DAY SURGERY FS (CUSTOM PROCEDURE TRAY) ×2 IMPLANT
PAD CAST 4YDX4 CTTN HI CHSV (CAST SUPPLIES) ×1 IMPLANT
PADDING CAST COTTON 4X4 STRL (CAST SUPPLIES) ×2
SUT ETHILON 4 0 PS 2 18 (SUTURE) ×2 IMPLANT
SYR 10ML LL (SYRINGE) ×2 IMPLANT
SYR BULB EAR ULCER 3OZ GRN STR (SYRINGE) ×2 IMPLANT
TOWEL OR 17X26 10 PK STRL BLUE (TOWEL DISPOSABLE) ×2 IMPLANT
TRAY DSU PREP LF (CUSTOM PROCEDURE TRAY) ×2 IMPLANT
UNDERPAD 30X36 HEAVY ABSORB (UNDERPADS AND DIAPERS) ×2 IMPLANT

## 2021-08-31 NOTE — Interval H&P Note (Signed)
History and Physical Interval Note:  08/31/2021 9:46 AM  George Roberts  has presented today for surgery, with the diagnosis of Right Carpal Tunnel Syndrome.  The various methods of treatment have been discussed with the patient and family. After consideration of risks, benefits and other options for treatment, the patient has consented to  Procedure(s) with comments: Lexa (Right) - with local anesthesia as a surgical intervention.  The patient's history has been reviewed, patient examined, no change in status, stable for surgery.  I have reviewed the patient's chart and labs.  Questions were answered to the patient's satisfaction.     [name]

## 2021-08-31 NOTE — Discharge Instructions (Addendum)
Orthopaedic Hand Surgery Discharge Instructions  WEIGHT BEARING STATUS: Non weight bearing on operative extremity  INCISION CARE: Keep dressing over your incision clean and dry until 5 days after surgery. You may shower by placing a waterproof covering over your dressing. Once dressing is removed, you may allow water to run over the incision and then place Band-Aids over incision. Do not scrub your incision or apply creams/lotions. Do not submerge your incision or swim for 3 weeks after surgery. Contact your surgeon or primary care doctor if you develop redness or drainage from your incision.   PAIN CONTROL: First line medications for post operative pain control are Tylenol (acetaminophen) and Motrin (ibuprofen) if you are able to take these medications. If you have been prescribed a medication these can be taken as breakthrough pain medications. Please note that some narcotic pain medication have acetaminophen added and you should never consume more than 4,000mg  of acetaminophen in 24 hour period. Also please note that if you are given Toradol (ketoralac) you should not take similar medications simultaneously such as ibuprofen.   ICE/ELEVATION: Ice and elevate your injured extremity as needed. Avoid direct contact of ice with skin.  HOME MEDICATIONS: No changes have been made to your home medications.  FOLLOW UP: You will be called after surgery with an appointment date and time, however if you have not received a phone call within 3 days please call during regular office hours at [phone] to schedule a post operative appointment.  Please Seek Medical Attention if: Call MD for: pain or pressure in chest, jaw, arm, back, neck  Call MD for: temperature greater than 101 F for more than 24 hours  Call MD for: difficulty breathing Call MD for: Incision redness, bleeding, drainage  Call MD for: palpitations or feeling that the heart is racing  Call MD for: increased swelling in arm, leg, ankle,  or abdomen  Call MD for: lightheadedness, dizziness, fainting Go to ED or call 911 if: chest pain does not go away after 3 nitroglycerin doses taken 5 min apart  Go to ED or call 911 for: any uncontrolled bleeding  Go to ED or call 911 if: unable to reach physician  Discharge Medications: Allergies as of 08/31/2021   No Known Allergies      Medication List     TAKE these medications    amLODipine 5 MG tablet Commonly known as: NORVASC Take 5 mg by mouth daily.   atorvastatin 10 MG tablet Commonly known as: LIPITOR Take 10 mg by mouth daily.   HYDROcodone-acetaminophen 5-325 MG tablet Commonly known as: NORCO/VICODIN Take 1 tablet by mouth every 6 (six) hours as needed for up to 3 days for moderate pain.   naproxen sodium 220 MG tablet Commonly known as: ALEVE Take 220 mg by mouth as needed.   ONE-A-DAY MENS PO Take 1 tablet by mouth daily.   valsartan 80 MG tablet Commonly known as: DIOVAN Take 80 mg by mouth daily.          Izell Pawnee, MD Orthopaedic Hand Surgeon [facility] Office number: [phone] [address], [address] [place], [place] [address]   Post Anesthesia Home Care Instructions  Activity: Get plenty of rest for the remainder of the day. A responsible individual must stay with you for 24 hours following the procedure.  For the next 24 hours, DO NOT: -Drive a car -Paediatric nurse -Drink alcoholic beverages -Take any medication unless instructed by your physician -Make any legal decisions or sign important papers.  Meals: Start with liquid foods such as gelatin or soup. Progress to regular foods as tolerated. Avoid greasy, spicy, heavy foods. If nausea and/or vomiting occur, drink only clear liquids until the nausea and/or vomiting subsides. Call your physician if vomiting continues.  Special Instructions/Symptoms: Your throat may feel dry or sore from the anesthesia or the breathing tube placed in your throat during surgery. If  this causes discomfort, gargle with warm salt water. The discomfort should disappear within 24 hours.  If you had a scopolamine patch placed behind your ear for the management of post- operative nausea and/or vomiting:  1. The medication in the patch is effective for 72 hours, after which it should be removed.  Wrap patch in a tissue and discard in the trash. Wash hands thoroughly with soap and water. 2. You may remove the patch earlier than 72 hours if you experience unpleasant side effects which may include dry mouth, dizziness or visual disturbances. 3. Avoid touching the patch. Wash your hands with soap and water after contact with the patch.

## 2021-08-31 NOTE — Transfer of Care (Addendum)
Immediate Anesthesia Transfer of Care Note  Patient: George Roberts  Procedure(s) Performed: Procedure(s) (LRB): CARPAL TUNNEL RELEASE (Right)  Patient Location: Phase 2  Anesthesia Type: MAC  Level of Consciousness: awake, alert , oriented and patient cooperative  Airway & Oxygen Therapy: Patient Spontanous Breathing on room air  Post-op Assessment: Report given to Phase 2 RN and Post -op Vital signs reviewed and stable  Post vital signs: Reviewed and stable  Complications: No apparent anesthesia complications Last Vitals:  Vitals Value Taken Time  BP    Temp    Pulse    Resp    SpO2      Last Pain:  Vitals:   08/31/21 0819  TempSrc: Oral  PainSc: 4       Patients Stated Pain Goal: 4 (50/27/74 1287)  Complications: No notable events documented.

## 2021-08-31 NOTE — Progress Notes (Signed)
No ekg needed per Dr singer

## 2021-08-31 NOTE — Op Note (Signed)
OPERATIVE NOTE  DATE OF PROCEDURE: 08/31/2021  SURGEON: Izell Wilburton, MD  PREOPERATIVE DIAGNOSIS: Right Carpal Tunnel Syndrome  POSTOPERATIVE DIAGNOSIS: Same  NAME OF PROCEDURE: Right Carpal Tunnel Release  ANESTHESIA: Local + MAC  SKIN PREPARATION: Hibiclens  ESTIMATED BLOOD LOSS: Minimal  IMPLANTS: none  INDICATIONS:  George Roberts is a 70 y.o. male who presents with right carpal tunnel syndrome, refractory to nonoperative treatment. The patient has decided to proceed with surgical intervention.  Risks, benefits and alternatives of operative management were discussed including, but not limited to, risks of anesthesia complications, infection, pain, persistent symptoms, stiffness, need for future surgery.  The patient understands, agrees and elects to proceed with surgery.    DESCRIPTION OF PROCEDURE: The patient was placed in the usual supine position and the right upper extremity was prepped and draped in normal sterile fashion.  After local block anesthetic to the right hand and wrist, a standard 1.5 cm incision was made in the midpalm.  This was carried down through the subcutaneous tissues and palmar fascia to the transverse carpal ligament.  The distal one-half of the transverse carpal ligament was incised longitudinally under direct vision using a #15 blade.  The carpal tunnel release guide was then placed under direct vision on the transverse carpal ligament and slid proximally.  The guide was palpated into appropriate alignment longitudinally.  Contact with the transverse carpal ligament was maintained throughout passing.  The blade was engaged into the guide and the remaining portion of the transverse carpal ligament released completely.  No other abnormalities were noted.  The wound was copiously irrigated and the skin closed using horizontal mattress #4-0 nylon sutures.  A light bulky dressing was placed.  The patient tolerated the procedure well and returned to the recovery room in  stable condition.  I was present for the entire surgical procedure.   Matt Holmes, MD

## 2021-08-31 NOTE — Anesthesia Postprocedure Evaluation (Signed)
Anesthesia Post Note  Patient: George Roberts  Procedure(s) Performed: CARPAL TUNNEL RELEASE (Right: Hand)     Patient location during evaluation: PACU Anesthesia Type: MAC Level of consciousness: awake and alert Pain management: pain level controlled Vital Signs Assessment: post-procedure vital signs reviewed and stable Respiratory status: spontaneous breathing and respiratory function stable Cardiovascular status: stable Postop Assessment: no apparent nausea or vomiting Anesthetic complications: no   No notable events documented.  Last Vitals:  Vitals:   08/31/21 1023 08/31/21 1055  BP: 130/77 140/87  Pulse: 76 70  Resp: 15 14  Temp: 36.7 C 36.7 C  SpO2: 96% 95%    Last Pain:  Vitals:   08/31/21 1055  TempSrc:   PainSc: 0-No pain                 Yamilee Harmes,Kc DANIEL

## 2021-09-01 ENCOUNTER — Encounter (HOSPITAL_BASED_OUTPATIENT_CLINIC_OR_DEPARTMENT_OTHER): Payer: Self-pay | Admitting: Orthopedic Surgery

## 2021-09-05 NOTE — Addendum Note (Signed)
Addendum  created 09/05/21 1049 by Rogers Blocker, CRNA   Charge Capture section accepted

## 2021-10-31 DIAGNOSIS — K648 Other hemorrhoids: Secondary | ICD-10-CM | POA: Diagnosis not present

## 2021-10-31 DIAGNOSIS — K644 Residual hemorrhoidal skin tags: Secondary | ICD-10-CM | POA: Diagnosis not present

## 2021-10-31 DIAGNOSIS — Z86010 Personal history of colonic polyps: Secondary | ICD-10-CM | POA: Diagnosis not present

## 2021-10-31 DIAGNOSIS — Z980 Intestinal bypass and anastomosis status: Secondary | ICD-10-CM | POA: Diagnosis not present

## 2021-10-31 DIAGNOSIS — K5730 Diverticulosis of large intestine without perforation or abscess without bleeding: Secondary | ICD-10-CM | POA: Diagnosis not present

## 2021-10-31 DIAGNOSIS — D124 Benign neoplasm of descending colon: Secondary | ICD-10-CM | POA: Diagnosis not present

## 2021-11-03 DIAGNOSIS — D124 Benign neoplasm of descending colon: Secondary | ICD-10-CM | POA: Diagnosis not present

## 2021-11-17 DIAGNOSIS — C44622 Squamous cell carcinoma of skin of right upper limb, including shoulder: Secondary | ICD-10-CM | POA: Diagnosis not present

## 2021-11-17 DIAGNOSIS — D225 Melanocytic nevi of trunk: Secondary | ICD-10-CM | POA: Diagnosis not present

## 2021-11-17 DIAGNOSIS — Z85828 Personal history of other malignant neoplasm of skin: Secondary | ICD-10-CM | POA: Diagnosis not present

## 2021-11-17 DIAGNOSIS — L918 Other hypertrophic disorders of the skin: Secondary | ICD-10-CM | POA: Diagnosis not present

## 2021-11-17 DIAGNOSIS — L820 Inflamed seborrheic keratosis: Secondary | ICD-10-CM | POA: Diagnosis not present

## 2021-11-17 DIAGNOSIS — L821 Other seborrheic keratosis: Secondary | ICD-10-CM | POA: Diagnosis not present

## 2021-11-17 DIAGNOSIS — D485 Neoplasm of uncertain behavior of skin: Secondary | ICD-10-CM | POA: Diagnosis not present

## 2021-11-17 DIAGNOSIS — D2262 Melanocytic nevi of left upper limb, including shoulder: Secondary | ICD-10-CM | POA: Diagnosis not present

## 2021-11-17 DIAGNOSIS — L570 Actinic keratosis: Secondary | ICD-10-CM | POA: Diagnosis not present

## 2022-01-26 DIAGNOSIS — L820 Inflamed seborrheic keratosis: Secondary | ICD-10-CM | POA: Diagnosis not present

## 2022-01-26 DIAGNOSIS — L918 Other hypertrophic disorders of the skin: Secondary | ICD-10-CM | POA: Diagnosis not present

## 2022-01-26 DIAGNOSIS — Z85828 Personal history of other malignant neoplasm of skin: Secondary | ICD-10-CM | POA: Diagnosis not present

## 2022-07-27 DIAGNOSIS — Z0000 Encounter for general adult medical examination without abnormal findings: Secondary | ICD-10-CM | POA: Diagnosis not present

## 2022-07-27 DIAGNOSIS — Z1211 Encounter for screening for malignant neoplasm of colon: Secondary | ICD-10-CM | POA: Diagnosis not present

## 2022-07-27 DIAGNOSIS — Z980 Intestinal bypass and anastomosis status: Secondary | ICD-10-CM | POA: Diagnosis not present

## 2022-07-27 DIAGNOSIS — N401 Enlarged prostate with lower urinary tract symptoms: Secondary | ICD-10-CM | POA: Diagnosis not present

## 2022-07-27 DIAGNOSIS — E7800 Pure hypercholesterolemia, unspecified: Secondary | ICD-10-CM | POA: Diagnosis not present

## 2022-07-27 DIAGNOSIS — R350 Frequency of micturition: Secondary | ICD-10-CM | POA: Diagnosis not present

## 2022-07-27 DIAGNOSIS — I10 Essential (primary) hypertension: Secondary | ICD-10-CM | POA: Diagnosis not present

## 2022-07-27 DIAGNOSIS — M62830 Muscle spasm of back: Secondary | ICD-10-CM | POA: Diagnosis not present

## 2022-07-27 DIAGNOSIS — Z1331 Encounter for screening for depression: Secondary | ICD-10-CM | POA: Diagnosis not present

## 2022-07-27 DIAGNOSIS — Z125 Encounter for screening for malignant neoplasm of prostate: Secondary | ICD-10-CM | POA: Diagnosis not present

## 2022-07-27 DIAGNOSIS — R739 Hyperglycemia, unspecified: Secondary | ICD-10-CM | POA: Diagnosis not present

## 2022-07-27 DIAGNOSIS — Z86010 Personal history of colonic polyps: Secondary | ICD-10-CM | POA: Diagnosis not present

## 2022-07-27 DIAGNOSIS — N529 Male erectile dysfunction, unspecified: Secondary | ICD-10-CM | POA: Diagnosis not present

## 2022-08-13 DIAGNOSIS — N411 Chronic prostatitis: Secondary | ICD-10-CM | POA: Diagnosis not present

## 2022-08-16 DIAGNOSIS — Z23 Encounter for immunization: Secondary | ICD-10-CM | POA: Diagnosis not present

## 2022-08-31 DIAGNOSIS — Z5181 Encounter for therapeutic drug level monitoring: Secondary | ICD-10-CM | POA: Diagnosis not present

## 2022-08-31 DIAGNOSIS — N411 Chronic prostatitis: Secondary | ICD-10-CM | POA: Diagnosis not present

## 2022-08-31 DIAGNOSIS — R8290 Unspecified abnormal findings in urine: Secondary | ICD-10-CM | POA: Diagnosis not present

## 2022-08-31 DIAGNOSIS — Z125 Encounter for screening for malignant neoplasm of prostate: Secondary | ICD-10-CM | POA: Diagnosis not present

## 2022-09-07 DIAGNOSIS — N179 Acute kidney failure, unspecified: Secondary | ICD-10-CM | POA: Diagnosis not present

## 2022-09-07 DIAGNOSIS — R972 Elevated prostate specific antigen [PSA]: Secondary | ICD-10-CM | POA: Diagnosis not present

## 2022-09-07 DIAGNOSIS — R319 Hematuria, unspecified: Secondary | ICD-10-CM | POA: Diagnosis not present

## 2022-09-07 DIAGNOSIS — R809 Proteinuria, unspecified: Secondary | ICD-10-CM | POA: Diagnosis not present

## 2022-09-13 DIAGNOSIS — R319 Hematuria, unspecified: Secondary | ICD-10-CM | POA: Diagnosis not present

## 2022-09-13 DIAGNOSIS — R972 Elevated prostate specific antigen [PSA]: Secondary | ICD-10-CM | POA: Diagnosis not present

## 2022-09-13 DIAGNOSIS — R310 Gross hematuria: Secondary | ICD-10-CM | POA: Diagnosis not present

## 2022-10-09 DIAGNOSIS — R310 Gross hematuria: Secondary | ICD-10-CM | POA: Diagnosis not present

## 2022-10-09 DIAGNOSIS — R319 Hematuria, unspecified: Secondary | ICD-10-CM | POA: Diagnosis not present

## 2022-10-12 DIAGNOSIS — R82998 Other abnormal findings in urine: Secondary | ICD-10-CM | POA: Diagnosis not present

## 2022-10-12 DIAGNOSIS — D414 Neoplasm of uncertain behavior of bladder: Secondary | ICD-10-CM | POA: Diagnosis not present

## 2022-10-25 DIAGNOSIS — R310 Gross hematuria: Secondary | ICD-10-CM | POA: Diagnosis not present

## 2022-10-25 DIAGNOSIS — D175 Benign lipomatous neoplasm of intra-abdominal organs: Secondary | ICD-10-CM | POA: Diagnosis not present

## 2022-10-25 DIAGNOSIS — C678 Malignant neoplasm of overlapping sites of bladder: Secondary | ICD-10-CM | POA: Diagnosis not present

## 2022-10-26 ENCOUNTER — Other Ambulatory Visit: Payer: Self-pay | Admitting: Urology

## 2022-10-30 ENCOUNTER — Encounter (HOSPITAL_COMMUNITY)
Admission: RE | Admit: 2022-10-30 | Discharge: 2022-10-30 | Disposition: A | Discharge status: Home / Self Care | Payer: Medicare Other | Source: Ambulatory Visit | Attending: Urology | Admitting: Urology

## 2022-10-30 ENCOUNTER — Other Ambulatory Visit: Payer: Self-pay

## 2022-10-30 ENCOUNTER — Encounter (HOSPITAL_COMMUNITY): Payer: Self-pay

## 2022-10-30 VITALS — BP 151/96 | HR 79 | Temp 98.3°F | Resp 16 | Ht 69.0 in

## 2022-10-30 DIAGNOSIS — Z01818 Encounter for other preprocedural examination: Secondary | ICD-10-CM | POA: Diagnosis not present

## 2022-10-30 LAB — BASIC METABOLIC PANEL
Anion gap: 8 (ref 5–15)
BUN: 14 mg/dL (ref 8–23)
CO2: 23 mmol/L (ref 22–32)
Calcium: 8.8 mg/dL — ABNORMAL LOW (ref 8.9–10.3)
Chloride: 111 mmol/L (ref 98–111)
Creatinine, Ser: 0.82 mg/dL (ref 0.61–1.24)
GFR, Estimated: >60 mL/min (ref >60)
Glucose, Bld: 86 mg/dL (ref 70–99)
Potassium: 4.1 mmol/L (ref 3.5–5.1)
Sodium: 142 mmol/L (ref 135–145)

## 2022-10-30 LAB — CBC
HCT: 46.6 % (ref 39.0–52.0)
Hemoglobin: 16 g/dL (ref 13.0–17.0)
MCH: 31.9 pg (ref 26.0–34.0)
MCHC: 34.3 g/dL (ref 30.0–36.0)
MCV: 93 fL (ref 80.0–100.0)
Platelets: 288 10*3/uL (ref 150–400)
RBC: 5.01 MIL/uL (ref 4.22–5.81)
RDW: 13.1 % (ref 11.5–15.5)
WBC: 11.3 10*3/uL — ABNORMAL HIGH (ref 4.0–10.5)
nRBC: 0 % (ref 0.0–0.2)

## 2022-10-30 NOTE — Anesthesia Preprocedure Evaluation (Signed)
Anesthesia Evaluation  Patient identified by MRN, date of birth, ID band Patient awake    Reviewed: Allergy & Precautions, NPO status , Patient's Chart, lab work & pertinent test results  History of Anesthesia Complications (+) Family history of anesthesia reaction  Airway Mallampati: II  TM Distance: >3 FB Neck ROM: Full    Dental  (+) Edentulous Upper, Missing, Dental Advisory Given   Pulmonary former smoker   Pulmonary exam normal breath sounds clear to auscultation       Cardiovascular hypertension, Pt. on medications Normal cardiovascular exam Rhythm:Regular Rate:Normal     Neuro/Psych negative neurological ROS     GI/Hepatic negative GI ROS, Neg liver ROS,,,  Endo/Other  negative endocrine ROS    Renal/GU negative Renal ROS     Musculoskeletal  (+) Arthritis ,    Abdominal  (+) + obese  Peds  Hematology negative hematology ROS (+)   Anesthesia Other Findings   Reproductive/Obstetrics                              Anesthesia Physical Anesthesia Plan  ASA: 2  Anesthesia Plan: General   Post-op Pain Management: Tylenol PO (pre-op)* and Minimal or no pain anticipated   Induction: Intravenous  PONV Risk Score and Plan: 3 and Ondansetron, Dexamethasone and Treatment may vary due to age or medical condition  Airway Management Planned: LMA and Oral ETT  Additional Equipment:   Intra-op Plan:   Post-operative Plan: Extubation in OR  Informed Consent: I have reviewed the patients History and Physical, chart, labs and discussed the procedure including the risks, benefits and alternatives for the proposed anesthesia with the patient or authorized representative who has indicated his/her understanding and acceptance.     Dental advisory given  Plan Discussed with: CRNA  Anesthesia Plan Comments:          Anesthesia Quick Evaluation

## 2022-10-30 NOTE — Progress Notes (Addendum)
Anesthesia Review:  PCP: DR Koleen Nimrod replaced Trude Mcburney at [place]  Cardiologist : none  Chest x-ray : EKG : 10/30/22  Echo : Stress test: Cardiac Cath :  Activity level:  can do a flight of stairs without difficutly  Sleep Study/ CPAP : none  Fasting Blood Sugar :      / Checks Blood Sugar -- times a day:   Blood Thinner/ Instructions /Last Dose: ASA / Instructions/ Last Dose :    At preop appat on 10/30/22 pt states he saw his mother who is in a nsg facility on 10/26/22.  Mother was  feeling fine at the time.  PT reports at preop that he received a phone call on 10/30/22 that mother tested positive for covid today.  PT reports at preop that he feels fine and he did a covid test on himself and it was negative.

## 2022-10-30 NOTE — Patient Instructions (Addendum)
SURGICAL WAITING ROOM VISITATION  Patients having surgery or a procedure may have no more than 2 support people in the waiting area - these visitors may rotate.    Children under the age of 40 must have an adult with them who is not the patient.  Due to an increase in RSV and influenza rates and associated hospitalizations, children ages 89 and under may not visit patients in [place].  If the patient needs to stay at the hospital during part of their recovery, the visitor guidelines for inpatient rooms apply. Pre-op nurse will coordinate an appropriate time for 1 support person to accompany patient in pre-op.  This support person may not rotate.    Please refer to the [facility] website for the visitor guidelines for Inpatients (after your surgery is over and you are in a regular room).       Your procedure is scheduled on: 10/31/2022    Report to [facility] Main Entrance    Report to admitting at   [phone]   Call this number if you have problems the morning of surgery [phone]   Do not eat food or drink liquids  :After Midnight.                         If you have questions, please contact your surgeon's office.       Oral Hygiene is also important to reduce your risk of infection.                                    Remember - BRUSH YOUR TEETH THE MORNING OF SURGERY WITH YOUR REGULAR TOOTHPASTE  DENTURES WILL BE REMOVED PRIOR TO SURGERY PLEASE DO NOT APPLY "Poly grip" OR ADHESIVES!!!   Do NOT smoke after Midnight   Take these medicines the morning of surgery with A SIP OF WATER:  amlodipine, flomax   DO NOT TAKE ANY ORAL DIABETIC MEDICATIONS DAY OF YOUR SURGERY  Bring CPAP mask and tubing day of surgery.                              You may not have any metal on your body including hair pins, jewelry, and body piercing             Do not wear make-up, lotions, powders, perfumes/cologne, or deodorant  Do not wear nail polish including gel  and S&S, artificial/acrylic nails, or any other type of covering on natural nails including finger and toenails. If you have artificial nails, gel coating, etc. that needs to be removed by a nail salon please have this removed prior to surgery or surgery may need to be canceled/ delayed if the surgeon/ anesthesia feels like they are unable to be safely monitored.   Do not shave  48 hours prior to surgery.               Men may shave face and neck.   Do not bring valuables to the hospital. [place].   Contacts, glasses, dentures or bridgework may not be worn into surgery.   Bring small overnight bag day of surgery.   DO NOT [place]. PHARMACY WILL DISPENSE MEDICATIONS LISTED  ON YOUR MEDICATION LIST TO YOU DURING YOUR ADMISSION [place]!    Patients discharged on the day of surgery will not be allowed to drive home.  Someone NEEDS to stay with you for the first 24 hours after anesthesia.   Special Instructions: Bring a copy of your healthcare power of attorney and living will documents the day of surgery if you haven't scanned them before.              Please read over the following fact sheets you were given: IF [place] [phone]   If you received a COVID test during your pre-op visit  it is requested that you wear a mask when out in public, stay away from anyone that may not be feeling well and notify your surgeon if you develop symptoms. If you test positive for Covid or have been in contact with anyone that has tested positive in the last 10 days please notify you surgeon.    [place] - Preparing for Surgery Before surgery, you can play an important role.  Because skin is not sterile, your skin needs to be as free of germs as possible.  You can reduce the number of germs on your skin by washing with CHG (chlorahexidine gluconate) soap  before surgery.  CHG is an antiseptic cleaner which kills germs and bonds with the skin to continue killing germs even after washing. Please DO NOT use if you have an allergy to CHG or antibacterial soaps.  If your skin becomes reddened/irritated stop using the CHG and inform your nurse when you arrive at Short Stay. Do not shave (including legs and underarms) for at least 48 hours prior to the first CHG shower.  You may shave your face/neck. Please follow these instructions carefully:  1.  Shower with CHG Soap the night before surgery and the  morning of Surgery.  2.  If you choose to wash your hair, wash your hair first as usual with your  normal  shampoo.  3.  After you shampoo, rinse your hair and body thoroughly to remove the  shampoo.                           4.  Use CHG as you would any other liquid soap.  You can apply chg directly  to the skin and wash                       Gently with a scrungie or clean washcloth.  5.  Apply the CHG Soap to your body ONLY FROM THE NECK DOWN.   Do not use on face/ open                           Wound or open sores. Avoid contact with eyes, ears mouth and genitals (private parts).                       Wash face,  Genitals (private parts) with your normal soap.             6.  Wash thoroughly, paying special attention to the area where your surgery  will be performed.  7.  Thoroughly rinse your body with warm water from the neck down.  8.  DO NOT shower/wash with your normal soap after using and rinsing off  the CHG Soap.                9.  Pat yourself dry with a clean towel.            10.  Wear clean pajamas.            11.  Place clean sheets on your bed the night of your first shower and do not  sleep with pets. Day of Surgery : Do not apply any lotions/deodorants the morning of surgery.  Please wear clean clothes to the hospital/surgery center.  FAILURE TO FOLLOW THESE INSTRUCTIONS MAY RESULT IN THE CANCELLATION OF YOUR SURGERY PATIENT  SIGNATURE_________________________________  NURSE SIGNATURE__________________________________  ________________________________________________________________________

## 2022-10-31 ENCOUNTER — Ambulatory Visit (HOSPITAL_COMMUNITY): Payer: Medicare Other

## 2022-10-31 ENCOUNTER — Observation Stay (HOSPITAL_COMMUNITY)
Admission: RE | Admit: 2022-10-31 | Discharge: 2022-11-01 | Disposition: A | Discharge status: Home / Self Care | Payer: Medicare Other | Attending: Urology | Admitting: Urology

## 2022-10-31 ENCOUNTER — Encounter (HOSPITAL_COMMUNITY)
Admission: RE | Disposition: A | Discharge status: Home / Self Care | Payer: Self-pay | Source: Home / Self Care | Attending: Urology

## 2022-10-31 ENCOUNTER — Encounter (HOSPITAL_COMMUNITY): Payer: Self-pay | Admitting: Urology

## 2022-10-31 ENCOUNTER — Ambulatory Visit (HOSPITAL_COMMUNITY): Payer: Medicare Other | Admitting: Anesthesiology

## 2022-10-31 ENCOUNTER — Ambulatory Visit (HOSPITAL_BASED_OUTPATIENT_CLINIC_OR_DEPARTMENT_OTHER): Payer: Medicare Other | Admitting: Anesthesiology

## 2022-10-31 DIAGNOSIS — Z79899 Other long term (current) drug therapy: Secondary | ICD-10-CM | POA: Diagnosis not present

## 2022-10-31 DIAGNOSIS — C678 Malignant neoplasm of overlapping sites of bladder: Secondary | ICD-10-CM | POA: Diagnosis not present

## 2022-10-31 DIAGNOSIS — Z87891 Personal history of nicotine dependence: Secondary | ICD-10-CM | POA: Insufficient documentation

## 2022-10-31 DIAGNOSIS — C679 Malignant neoplasm of bladder, unspecified: Principal | ICD-10-CM | POA: Diagnosis present

## 2022-10-31 DIAGNOSIS — I10 Essential (primary) hypertension: Secondary | ICD-10-CM | POA: Insufficient documentation

## 2022-10-31 DIAGNOSIS — Z01818 Encounter for other preprocedural examination: Secondary | ICD-10-CM

## 2022-10-31 DIAGNOSIS — D494 Neoplasm of unspecified behavior of bladder: Secondary | ICD-10-CM | POA: Diagnosis not present

## 2022-10-31 DIAGNOSIS — N3090 Cystitis, unspecified without hematuria: Secondary | ICD-10-CM | POA: Diagnosis not present

## 2022-10-31 DIAGNOSIS — Z96642 Presence of left artificial hip joint: Secondary | ICD-10-CM | POA: Insufficient documentation

## 2022-10-31 HISTORY — PX: CYSTOSCOPY W/ RETROGRADES: SHX1426

## 2022-10-31 HISTORY — PX: TRANSURETHRAL RESECTION OF BLADDER TUMOR: SHX2575

## 2022-10-31 LAB — HEMOGLOBIN AND HEMATOCRIT, BLOOD
HCT: 46.7 % (ref 39.0–52.0)
Hemoglobin: 15.8 g/dL (ref 13.0–17.0)

## 2022-10-31 SURGERY — TURBT (TRANSURETHRAL RESECTION OF BLADDER TUMOR)
Anesthesia: General

## 2022-10-31 MED ORDER — IOHEXOL 300 MG/ML  SOLN
INTRAMUSCULAR | Status: DC | PRN
  Administered 2022-10-31: 18 mL

## 2022-10-31 MED ORDER — PHENYLEPHRINE 80 MCG/ML (10ML) SYRINGE FOR IV PUSH (FOR BLOOD PRESSURE SUPPORT)
PREFILLED_SYRINGE | INTRAVENOUS | Status: AC
  Filled 2022-10-31: qty 10

## 2022-10-31 MED ORDER — SODIUM CHLORIDE 0.9 % IV SOLN: INTRAVENOUS | Status: DC

## 2022-10-31 MED ORDER — ONDANSETRON HCL 4 MG/2ML IJ SOLN
INTRAMUSCULAR | Status: AC
  Filled 2022-10-31: qty 2

## 2022-10-31 MED ORDER — PROPOFOL 10 MG/ML IV BOLUS
INTRAVENOUS | Status: DC | PRN
  Administered 2022-10-31: 40 mg via INTRAVENOUS
  Administered 2022-10-31: 200 mg via INTRAVENOUS

## 2022-10-31 MED ORDER — PROPOFOL 10 MG/ML IV BOLUS
INTRAVENOUS | Status: AC
  Filled 2022-10-31: qty 20

## 2022-10-31 MED ORDER — PHENYLEPHRINE HCL-NACL 20-0.9 MG/250ML-% IV SOLN
INTRAVENOUS | Status: DC | PRN
  Administered 2022-10-31: 25 ug/min via INTRAVENOUS

## 2022-10-31 MED ORDER — FENTANYL CITRATE (PF) 250 MCG/5ML IJ SOLN
INTRAMUSCULAR | Status: DC | PRN
  Administered 2022-10-31 (×6): 50 ug via INTRAVENOUS

## 2022-10-31 MED ORDER — SODIUM CHLORIDE 0.9 % IR SOLN
Status: DC | PRN
  Administered 2022-10-31: 6000 mL
  Administered 2022-10-31: 3000 mL
  Administered 2022-10-31 (×4): 6000 mL

## 2022-10-31 MED ORDER — FENTANYL CITRATE (PF) 250 MCG/5ML IJ SOLN
INTRAMUSCULAR | Status: AC
  Filled 2022-10-31: qty 5

## 2022-10-31 MED ORDER — ACETAMINOPHEN 500 MG PO TABS
1000.0000 mg | ORAL_TABLET | Freq: Three times a day (TID) | ORAL | Status: DC
  Administered 2022-10-31 – 2022-11-01 (×4): 1000 mg via ORAL
  Filled 2022-10-31 (×4): qty 2

## 2022-10-31 MED ORDER — FENTANYL CITRATE PF 50 MCG/ML IJ SOSY
PREFILLED_SYRINGE | INTRAMUSCULAR | Status: AC
  Administered 2022-10-31: 25 ug via INTRAVENOUS
  Filled 2022-10-31: qty 1

## 2022-10-31 MED ORDER — ATORVASTATIN CALCIUM 10 MG PO TABS
10.0000 mg | ORAL_TABLET | Freq: Every day | ORAL | Status: DC
  Administered 2022-10-31: 10 mg via ORAL
  Filled 2022-10-31: qty 1

## 2022-10-31 MED ORDER — ORAL CARE MOUTH RINSE: 15.0000 mL | Freq: Once | OROMUCOSAL | Status: AC

## 2022-10-31 MED ORDER — LACTATED RINGERS IV SOLN: INTRAVENOUS | Status: DC

## 2022-10-31 MED ORDER — SODIUM CHLORIDE 0.9 % IR SOLN
3000.0000 mL | Status: DC
  Administered 2022-10-31 – 2022-11-01 (×3): 3000 mL

## 2022-10-31 MED ORDER — AMLODIPINE BESYLATE 5 MG PO TABS
5.0000 mg | ORAL_TABLET | Freq: Every day | ORAL | Status: DC
  Administered 2022-11-01: 5 mg via ORAL
  Filled 2022-10-31: qty 1

## 2022-10-31 MED ORDER — LIDOCAINE 2% (20 MG/ML) 5 ML SYRINGE
INTRAMUSCULAR | Status: DC | PRN
  Administered 2022-10-31: 100 mg via INTRAVENOUS

## 2022-10-31 MED ORDER — OXYCODONE HCL 5 MG PO TABS
5.0000 mg | ORAL_TABLET | ORAL | Status: DC | PRN
  Administered 2022-10-31 – 2022-11-01 (×5): 5 mg via ORAL
  Filled 2022-10-31 (×5): qty 1

## 2022-10-31 MED ORDER — FENTANYL CITRATE PF 50 MCG/ML IJ SOSY
PREFILLED_SYRINGE | INTRAMUSCULAR | Status: AC
  Administered 2022-10-31: 50 ug via INTRAVENOUS
  Filled 2022-10-31: qty 1

## 2022-10-31 MED ORDER — SENNOSIDES-DOCUSATE SODIUM 8.6-50 MG PO TABS
1.0000 | ORAL_TABLET | Freq: Two times a day (BID) | ORAL | Status: DC
  Administered 2022-11-01: 1 via ORAL
  Filled 2022-10-31 (×2): qty 1

## 2022-10-31 MED ORDER — GENTAMICIN SULFATE 40 MG/ML IJ SOLN
5.0000 mg/kg | INTRAVENOUS | Status: AC
  Administered 2022-10-31: 520 mg via INTRAVENOUS
  Filled 2022-10-31: qty 13

## 2022-10-31 MED ORDER — CHLORHEXIDINE GLUCONATE 0.12 % MT SOLN
15.0000 mL | Freq: Once | OROMUCOSAL | Status: AC
  Administered 2022-10-31: 15 mL via OROMUCOSAL

## 2022-10-31 MED ORDER — HYDROMORPHONE HCL 1 MG/ML IJ SOLN: 0.5000 mg | INTRAMUSCULAR | Status: DC | PRN

## 2022-10-31 MED ORDER — MIDAZOLAM HCL 2 MG/2ML IJ SOLN
INTRAMUSCULAR | Status: AC
  Filled 2022-10-31: qty 2

## 2022-10-31 MED ORDER — PHENYLEPHRINE HCL (PRESSORS) 10 MG/ML IV SOLN
INTRAVENOUS | Status: AC
  Filled 2022-10-31: qty 1

## 2022-10-31 MED ORDER — LIDOCAINE HCL (PF) 2 % IJ SOLN
INTRAMUSCULAR | Status: AC
  Filled 2022-10-31: qty 5

## 2022-10-31 MED ORDER — IRBESARTAN 150 MG PO TABS
75.0000 mg | ORAL_TABLET | Freq: Every day | ORAL | Status: DC
  Administered 2022-11-01: 75 mg via ORAL
  Filled 2022-10-31: qty 1

## 2022-10-31 MED ORDER — ACETAMINOPHEN 500 MG PO TABS
1000.0000 mg | ORAL_TABLET | Freq: Once | ORAL | Status: AC
  Administered 2022-10-31: 1000 mg via ORAL
  Filled 2022-10-31: qty 2

## 2022-10-31 MED ORDER — PHENYLEPHRINE 80 MCG/ML (10ML) SYRINGE FOR IV PUSH (FOR BLOOD PRESSURE SUPPORT)
PREFILLED_SYRINGE | INTRAVENOUS | Status: DC | PRN
  Administered 2022-10-31: 120 ug via INTRAVENOUS
  Administered 2022-10-31: 80 ug via INTRAVENOUS
  Administered 2022-10-31: 160 ug via INTRAVENOUS
  Administered 2022-10-31: 80 ug via INTRAVENOUS
  Administered 2022-10-31: 160 ug via INTRAVENOUS
  Administered 2022-10-31: 40 ug via INTRAVENOUS
  Administered 2022-10-31: 160 ug via INTRAVENOUS

## 2022-10-31 MED ORDER — MIDAZOLAM HCL 5 MG/5ML IJ SOLN
INTRAMUSCULAR | Status: DC | PRN
  Administered 2022-10-31: 2 mg via INTRAVENOUS

## 2022-10-31 MED ORDER — PROMETHAZINE HCL 25 MG/ML IJ SOLN: 6.2500 mg | INTRAMUSCULAR | Status: DC | PRN

## 2022-10-31 MED ORDER — ONDANSETRON HCL 4 MG/2ML IJ SOLN
INTRAMUSCULAR | Status: DC | PRN
  Administered 2022-10-31: 4 mg via INTRAVENOUS

## 2022-10-31 MED ORDER — FENTANYL CITRATE PF 50 MCG/ML IJ SOSY
25.0000 ug | PREFILLED_SYRINGE | INTRAMUSCULAR | Status: DC | PRN
  Administered 2022-10-31 (×2): 25 ug via INTRAVENOUS

## 2022-10-31 MED ORDER — DEXAMETHASONE SODIUM PHOSPHATE 10 MG/ML IJ SOLN
INTRAMUSCULAR | Status: AC
  Filled 2022-10-31: qty 1

## 2022-10-31 MED ORDER — DEXAMETHASONE SODIUM PHOSPHATE 10 MG/ML IJ SOLN
INTRAMUSCULAR | Status: DC | PRN
  Administered 2022-10-31: 10 mg via INTRAVENOUS

## 2022-10-31 SURGICAL SUPPLY — 27 items
BAG COUNTER SPONGE SURGICOUNT (BAG) IMPLANT
BAG DRN RND TRDRP ANRFLXCHMBR (UROLOGICAL SUPPLIES) ×2
BAG SPNG CNTER NS LX DISP (BAG)
BAG URINE DRAIN 2000ML AR STRL (UROLOGICAL SUPPLIES) IMPLANT
BAG URO CATCHER STRL LF (MISCELLANEOUS) ×2 IMPLANT
CATH HEMA 3WAY 30CC 24FR COUDE (CATHETERS) IMPLANT
CATH URETL OPEN END 6FR 70 (CATHETERS) IMPLANT
CLOTH BEACON ORANGE TIMEOUT ST (SAFETY) ×2 IMPLANT
DRAPE FOOT SWITCH (DRAPES) ×2 IMPLANT
ELECT REM PT RETURN 15FT ADLT (MISCELLANEOUS) ×2 IMPLANT
EVACUATOR MICROVAS BLADDER (UROLOGICAL SUPPLIES) IMPLANT
GLOVE SURG LX STRL 7.5 STRW (GLOVE) ×2 IMPLANT
GOWN SRG XL LVL 4 BRTHBL STRL (GOWNS) ×2 IMPLANT
GOWN STRL NON-REIN XL LVL4 (GOWNS) ×2
GOWN STRL REUS W/ TWL XL LVL3 (GOWN DISPOSABLE) ×2 IMPLANT
GOWN STRL REUS W/TWL XL LVL3 (GOWN DISPOSABLE) ×2
GUIDEWIRE STR DUAL SENSOR (WIRE) ×2 IMPLANT
KIT TURNOVER KIT A (KITS) IMPLANT
LOOP CUT BIPOLAR 24F LRG (ELECTROSURGICAL) IMPLANT
MANIFOLD NEPTUNE II (INSTRUMENTS) ×2 IMPLANT
NS IRRIG 1000ML POUR BTL (IV SOLUTION) IMPLANT
PACK CYSTO (CUSTOM PROCEDURE TRAY) ×2 IMPLANT
SYR 20ML LL LF (SYRINGE) IMPLANT
SYR TOOMEY IRRIG 70ML (MISCELLANEOUS) ×2
SYRINGE TOOMEY IRRIG 70ML (MISCELLANEOUS) IMPLANT
TUBING CONNECTING 10 (TUBING) ×2 IMPLANT
TUBING UROLOGY SET (TUBING) ×2 IMPLANT

## 2022-10-31 NOTE — Op Note (Signed)
NAME: George Roberts, George Roberts MEDICAL RECORD NO: [id number] ACCOUNT NO: [id number] DATE OF BIRTH: [birth year] FACILITY: [facility] LOCATION: [facility] PHYSICIAN: Alexis Frock, MD  Operative Report   DATE OF PROCEDURE: 10/31/2022  PREOPERATIVE DIAGNOSIS:  Very large volume bladder cancer.  PROCEDURES: 1.  Transurethral resection of bladder tumor, volume large. 2.  Bilateral retrograde pyelograms.  ESTIMATED BLOOD LOSS:  150 mL.  COMPLICATIONS:  None.  SPECIMENS:   1.  Bladder tumor. 2.  Base of bladder tumor for permanent pathology.  FINDINGS:   1.  Medial deviation of right ureter consistent with mass effect from known large retroperitoneal lipoma. 2.  Unremarkable left retrograde pyelogram. 3.  Massive volume bladder cancer. 4.  Resolution of all accessible papillary tumor following transurethral resection of bladder tumor. 5.  Significant concern for muscle invasive disease.  INDICATIONS:  The patient is a very pleasant 71 year old man who was found on workup of gross hematuria to have a very large volume bladder cancer, greater than 7 cm.  No obvious locally advanced or distant disease.  He also was found on staging imaging  to have a very large iliopsoas retroperitoneal lipoma with mass effect on his right kidney and ureter as well as his abdominal viscera.  Options were discussed for initial management including proceeding directly with cystectomy given the volume of tumor  versus more stepwise approach starting with transurethral resection for maximal debulking tissue typing and then proceeding pending pathology and we both agreed on the latter given the serious implications of cystectomy.  He presents for this today.   Informed consent was obtained and placed in medical record.  PROCEDURE IN DETAIL:  The patient being identified and verified, procedure being transurethral resection of bladder tumor with retrogrades confirmed.  Procedure timeout was performed.  Intravenous antibiotics  were administered.  General LMA anesthesia  was induced.  The patient was placed into a low lithotomy position.  Sterile field was created, prepped and draped the patient's penis, perineum, and proximal thighs using iodine.  Cystourethroscopy was performed using 21-French rigid cystoscope with  offset lens.  Inspection of the anterior and posterior urethra were relatively unremarkable.  There was some bilobar prostatic hypertrophy.  Inspection of urinary bladder revealed massive volume papillary bladder cancer.  This nearly filled approximately  50% of the entire lumen of the bladder.  There were 3 dominant foci, the largest right bladder neck extending just lateral to the right ureteral orifice and another left superior bladder neck and another left superior bladder. Amazingly ureteral  orifices were visualized and grossly patent.  Attention was directed at retrograde pyelograms the left ureteral orifice was cannulated with 6-French open-ended catheter, and a left retrograde pyelogram was obtained.  Left retrograde pyelogram demonstrated single left ureter, single system left kidney.  No filling defects or narrowing noted.  Next, right retrograde pyelogram was obtained.  Right retrograde pyelogram demonstrated single right ureter, single system right kidney.  There was significant medial deviation of the right ureter consistent with a mass effect from known large retroperitoneal lipoma.  Notably, no right hydronephrosis  noted.  Cystoscope was exchanged for the 26-French resectoscope sheath with visual obturator and using resectoscope loop, very careful systematic resection was performed starting with a dominant focus of tumor on the right bladder neck, first in a bottom  up fashion as to minimize potential damage to the ureteral orifice, as the tumor was just lateral to this.  This was performed for approximately 50% of the volume of the tumor.  Next, a top  down approach was used for the remaining 50% of  this tumor.  Again,  this tumor was massive and quite vascular and systematic resection did result in complete resolution of all this intraluminal tumor from this focus.  Next, attention was directed at the left superior focus of tumor, which is similarly resected down to  superficial fibromuscular stroma of the urinary bladder and then finally the left bladder neck focus, which was resected proximally. Given the acute angulation of this, there was a small amount of papillary tumor that was likely still present from the  sulcus.  However, all accessible left renal stone was removed.  Multiple cycles of irrigation of the tumor fragments were performed To[place]y syringe and his very large volume bladder tumor set aside labeled as bladder tumor.  Next cold cup biopsy forceps  were used to obtain representative seromuscular deep bites from the area of the dominant focus of tumor in the right bladder neck area.  These visibly contained muscle fibers.  These were set aside, labeled as base of bladder tumor.  The cystoscope was then used to fulgurate all areas of resection again as well as the bladder neck circumferentially, which showed adequate hemostasis.  However, given the very large volume of tumor it was clearly felt that bladder rest with catheter was  warranted and observation overnight on irrigation, as a new 24-French hematuria type Foley catheter was placed, 20 mL of water in the balloon.  This was connected to normal saline irrigation, efflux light pink and placed on gentle catheter strap  traction.  The procedure was terminated.  The patient tolerated the procedure well, no immediate perioperative complications.  The patient was taken to postanesthesia care in stable condition.  Plan for observation admission.   PUS D: 10/31/2022 10:37:08 am T: 10/31/2022 11:21:00 am  JOB: [id number]/ [id number]

## 2022-10-31 NOTE — Transfer of Care (Signed)
Immediate Anesthesia Transfer of Care Note  Patient: George Roberts  Procedure(s) Performed: TRANSURETHRAL RESECTION OF BLADDER TUMOR (TURBT) CYSTOSCOPY WITH RETROGRADE PYELOGRAM (Bilateral)  Patient Location: PACU  Anesthesia Type:General  Level of Consciousness: oriented, drowsy, and patient cooperative  Airway & Oxygen Therapy: Patient Spontanous Breathing and Patient connected to face mask oxygen  Post-op Assessment: Report given to RN and Post -op Vital signs reviewed and stable  Post vital signs: Reviewed  Last Vitals:  Vitals Value Taken Time  BP 131/69 10/31/22 1037  Temp    Pulse 85 10/31/22 1039  Resp 14 10/31/22 1039  SpO2 92 % 10/31/22 1039  Vitals shown include unvalidated device data.  Last Pain:  Vitals:   10/31/22 0635  TempSrc: Oral  PainSc: 0-No pain      Patients Stated Pain Goal: 4 (63/84/66 5993)  Complications: No notable events documented.

## 2022-10-31 NOTE — Anesthesia Postprocedure Evaluation (Signed)
Anesthesia Post Note  Patient: George Roberts  Procedure(s) Performed: TRANSURETHRAL RESECTION OF BLADDER TUMOR (TURBT) CYSTOSCOPY WITH RETROGRADE PYELOGRAM (Bilateral)     Patient location during evaluation: PACU Anesthesia Type: General Level of consciousness: sedated Pain management: pain level controlled Vital Signs Assessment: post-procedure vital signs reviewed and stable Respiratory status: spontaneous breathing and respiratory function stable Cardiovascular status: stable Postop Assessment: no apparent nausea or vomiting Anesthetic complications: no   No notable events documented.  Last Vitals:  Vitals:   10/31/22 1130 10/31/22 1145  BP: 115/76 127/75  Pulse: 88 88  Resp: 13 13  Temp:    SpO2: 91% 92%    Last Pain:  Vitals:   10/31/22 1130  TempSrc:   PainSc: 5                  Elad Macphail,Zebulen DANIEL

## 2022-10-31 NOTE — Brief Op Note (Signed)
10/31/2022  10:30 AM  PATIENT:  George Roberts  71 y.o. male  PRE-OPERATIVE DIAGNOSIS:  LARGE BLADDER TUMOR  POST-OPERATIVE DIAGNOSIS:  LARGE BLADDER TUMOR  PROCEDURE:  Procedure(s) with comments: TRANSURETHRAL RESECTION OF BLADDER TUMOR (TURBT) (N/A) - 2 HRS CYSTOSCOPY WITH RETROGRADE PYELOGRAM (Bilateral)  SURGEON:  Surgeon(s) and Role:    * Alexis Frock, MD - Primary  PHYSICIAN ASSISTANT:   ASSISTANTS: none   ANESTHESIA:   general  EBL:  147m   BLOOD ADMINISTERED:none  DRAINS:  30F 3 way foley to NS irrigation    LOCAL MEDICATIONS USED:  NONE  SPECIMEN:  Source of Specimen:  1 - bladder tumor; 2 - base of bladder tumor  DISPOSITION OF SPECIMEN:  PATHOLOGY  COUNTS:  YES  TOURNIQUET:  * No tourniquets in log *  DICTATION: .Other Dictation: Dictation Number [id number] PLAN OF CARE: Admit for overnight observation  PATIENT DISPOSITION:  PACU - hemodynamically stable.   Delay start of Pharmacological VTE agent (>24hrs) due to surgical blood loss or risk of bleeding: yes

## 2022-10-31 NOTE — H&P (Signed)
George Roberts is an 71 y.o. male.    Chief Complaint: Pre-Op Transurethral Resection of Large Bladder Tumor  HPI:   1 - Large Volume Bladder Cancer - >7cm multifocal papillary tumors on Ct and office cysto 10/2022 on eval hematuria. Remote >60PY smoker. Cytology atypical. No tissue path yet.   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 Significant mass effect and medial deviation of Rt ureter. Mass effect may make future urinary diversion problematic.   PMH sig for obesity, segmental open colon resection for large polyp (follows Q2 year colonoscopy with GI), no ischemic CV disease / blood thinners. 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 His PCP is George Roberts.   Today " George Roberts" is seen to proceed with TURBT/Retrogrades with goal of tissue diagnosis, staging, and initial treatment of his large volume bladder cancer. Cr 0.82, Hgb 16, most recent UCX negative.   Past Medical History:  Diagnosis Date   Family history of adverse reaction to anesthesia    mother--- slow to wake   Hypertension    followed by pcp--- pt stated has nuclear stress test approx 2012 told normal   OA (osteoarthritis)    Wears dentures    upper    Past Surgical History:  Procedure Laterality Date   CARPAL TUNNEL RELEASE Right 08/31/2021   Procedure: CARPAL TUNNEL RELEASE;  Surgeon: SOrene Desanctis MD;  [place]  Service: Orthopedics;  Laterality: Right;  with local anesthesia   COLONOSCOPY  2019   HAND TENDON SURGERY Left 1998   approx   TOTAL HIP ARTHROPLASTY  10/03/2011   Procedure: TOTAL HIP ARTHROPLASTY;  Surgeon: FDione PloverAluisio;  [facility];  Service: Orthopedics;  Laterality: Left;    History reviewed. No pertinent family history. Social History:  reports that he quit smoking about 32 years ago. His smoking use included cigarettes. He does not have any smokeless tobacco history on  file. He reports current alcohol use. He reports that he does not use drugs.  Allergies: Not on File  Medications Prior to Admission  Medication Sig Dispense Refill   amLODipine (NORVASC) 5 MG tablet Take 5 mg by mouth daily.     atorvastatin (LIPITOR) 10 MG tablet Take 10 mg by mouth daily.     cholecalciferol (VITAMIN D3) 25 MCG (1000 UNIT) tablet Take 1,000 Units by mouth daily.     Multiple Vitamin (ONE-A-DAY MENS PO) Take 1 tablet by mouth daily.     tamsulosin (FLOMAX) 0.4 MG CAPS capsule Take 0.4 mg by mouth daily.     valsartan (DIOVAN) 80 MG tablet Take 80 mg by mouth daily.      Results for orders placed or performed during the hospital encounter of 10/30/22 (from the past 48 hour(s))  Basic metabolic panel per protocol     Status: Abnormal   Collection Time: 10/30/22  1:00 PM  Result Value Ref Range   Sodium 142 135 - 145 mmol/L   Potassium 4.1 3.5 - 5.1 mmol/L   Chloride 111 98 - 111 mmol/L   CO2 23 22 - 32 mmol/L   Glucose, Bld 86 70 - 99 mg/dL    Comment: Glucose reference range applies only to samples taken after fasting for at least 8 hours.   BUN 14 8 - 23 mg/dL   Creatinine, Ser 0.82 0.61 - 1.24 mg/dL   Calcium 8.8 (L) 8.9 - 10.3  mg/dL   GFR, Estimated >60 >60 mL/min    Comment: (NOTE) Calculated using the CKD-EPI Creatinine Equation (2021)    Anion gap 8 5 - 15    Comment: Performed at [facility], [place] [address], [place], [place] [address]  CBC per protocol     Status: Abnormal   Collection Time: 10/30/22  1:00 PM  Result Value Ref Range   WBC 11.3 (H) 4.0 - 10.5 K/uL   RBC 5.01 4.22 - 5.81 MIL/uL   Hemoglobin 16.0 13.0 - 17.0 g/dL   HCT 46.6 39.0 - 52.0 %   MCV 93.0 80.0 - 100.0 fL   MCH 31.9 26.0 - 34.0 pg   MCHC 34.3 30.0 - 36.0 g/dL   RDW 13.1 11.5 - 15.5 %   Platelets 288 150 - 400 K/uL   nRBC 0.0 0.0 - 0.2 %    Comment: Performed at [facility], [place] [address] [place], [place] [address]   No results  found.  Review of Systems  Constitutional:  Negative for chills and fever.  All other systems reviewed and are negative.   Blood pressure 139/86, pulse 94, temperature 99 F (37.2 C), temperature source Oral, resp. rate 18, height '5\' 9"'$  (1.753 m), weight 103.9 kg, SpO2 97 %. Physical Exam Vitals reviewed.  HENT:     Head: Normocephalic.  Eyes:     Pupils: Pupils are equal, round, and reactive to light.  Cardiovascular:     Rate and Rhythm: Normal rate.  Abdominal:     General: Abdomen is flat.     Comments: Stable abd scars  Genitourinary:    Comments: No CVAT at present Musculoskeletal:        General: Normal range of motion.  Neurological:     General: No focal deficit present.     Mental Status: He is alert.  Psychiatric:        Mood and Affect: Mood normal.      Assessment/Plan  Proceed as planned with TURBT, retrogrades. Risks, benefits, alternatives, expectec peri-op course (including need for possible staged approach or even eventual cystectomy) discussed previously and reiterated today.   Alexis Frock, MD 10/31/2022, 7:54 AM

## 2022-10-31 NOTE — Anesthesia Procedure Notes (Signed)
Procedure Name: LMA Insertion Date/Time: 10/31/2022 8:38 AM  Performed by: Jenne Campus, CRNAPre-anesthesia Checklist: Patient identified, Emergency Drugs available, Suction available and Patient being monitored Patient Re-evaluated:Patient Re-evaluated prior to induction Oxygen Delivery Method: Circle System Utilized Preoxygenation: Pre-oxygenation with 100% oxygen Induction Type: IV induction Ventilation: Mask ventilation without difficulty LMA: LMA inserted LMA Size: 5.0 Number of attempts: 1 Placement Confirmation: positive ETCO2 and breath sounds checked- equal and bilateral Tube secured with: Tape Dental Injury: Teeth and Oropharynx as per pre-operative assessment

## 2022-10-31 NOTE — Discharge Instructions (Signed)
1 - You may have urinary urgency (bladder spasms) and bloody urine on / off for up to 2 weeks.  This is normal.  2 - Call MD or go to ER for fever >102, severe pain / nausea / vomiting not relieved by medications, or acute change in medical status  

## 2022-11-01 ENCOUNTER — Encounter (HOSPITAL_COMMUNITY): Payer: Self-pay | Admitting: Urology

## 2022-11-01 DIAGNOSIS — Z79899 Other long term (current) drug therapy: Secondary | ICD-10-CM | POA: Diagnosis not present

## 2022-11-01 DIAGNOSIS — Z96642 Presence of left artificial hip joint: Secondary | ICD-10-CM | POA: Diagnosis not present

## 2022-11-01 DIAGNOSIS — I10 Essential (primary) hypertension: Secondary | ICD-10-CM | POA: Diagnosis not present

## 2022-11-01 DIAGNOSIS — C679 Malignant neoplasm of bladder, unspecified: Secondary | ICD-10-CM | POA: Diagnosis not present

## 2022-11-01 DIAGNOSIS — Z87891 Personal history of nicotine dependence: Secondary | ICD-10-CM | POA: Diagnosis not present

## 2022-11-01 LAB — BASIC METABOLIC PANEL
Anion gap: 7 (ref 5–15)
BUN: 16 mg/dL (ref 8–23)
CO2: 24 mmol/L (ref 22–32)
Calcium: 8 mg/dL — ABNORMAL LOW (ref 8.9–10.3)
Chloride: 109 mmol/L (ref 98–111)
Creatinine, Ser: 0.95 mg/dL (ref 0.61–1.24)
GFR, Estimated: >60 mL/min (ref >60)
Glucose, Bld: 143 mg/dL — ABNORMAL HIGH (ref 70–99)
Potassium: 4.3 mmol/L (ref 3.5–5.1)
Sodium: 140 mmol/L (ref 135–145)

## 2022-11-01 LAB — SURGICAL PATHOLOGY

## 2022-11-01 LAB — HEMOGLOBIN AND HEMATOCRIT, BLOOD
HCT: 42.5 % (ref 39.0–52.0)
Hemoglobin: 14.6 g/dL (ref 13.0–17.0)

## 2022-11-01 MED ORDER — HYDROCODONE-ACETAMINOPHEN 5-325 MG PO TABS: 1.0000 | ORAL_TABLET | Freq: Four times a day (QID) | ORAL | 0 refills | Status: DC | PRN

## 2022-11-01 MED ORDER — CEPHALEXIN 500 MG PO CAPS: 500.0000 mg | ORAL_CAPSULE | Freq: Two times a day (BID) | ORAL | 0 refills | Status: AC

## 2022-11-01 MED ORDER — ORAL CARE MOUTH RINSE: 15.0000 mL | OROMUCOSAL | Status: DC | PRN

## 2022-11-01 MED ORDER — SENNOSIDES-DOCUSATE SODIUM 8.6-50 MG PO TABS: 1.0000 | ORAL_TABLET | Freq: Two times a day (BID) | ORAL | 0 refills | Status: DC

## 2022-11-01 MED ORDER — TRIPLE ANTIBIOTIC 3.5-400-5000 EX OINT: 1.0000 | TOPICAL_OINTMENT | CUTANEOUS | Status: DC | PRN

## 2022-11-01 NOTE — Plan of Care (Signed)
  Problem: Education: Goal: Knowledge of the prescribed therapeutic regimen will improve Outcome: Progressing   Problem: Skin Integrity: Goal: Demonstration of wound healing without infection will improve Outcome: Progressing   Problem: Clinical Measurements: Goal: Will remain free from infection Outcome: Progressing   Problem: Activity: Goal: Risk for activity intolerance will decrease Outcome: Progressing

## 2022-11-01 NOTE — Progress Notes (Signed)
.   Transition of Care [facility]) Screening Note   Patient Details  Name: SELIG WAMPOLE Date of Birth: 11/04/1951   Transition of Care [facility]) CM/SW Contact:    Illene Regulus, LCSW Phone Number: 11/01/2022, 9:54 AM    Transition of Care Department [facility]) has reviewed patient and no TOC needs have been identified at this time. We will continue to monitor patient advancement through interdisciplinary progression rounds. If new patient transition needs arise, please place a TOC consult.

## 2022-11-01 NOTE — Discharge Summary (Signed)
Physician Discharge Summary  Patient ID: George Roberts MRN: [id number] DOB/AGE: October 23, 1951 71 y.o.  Admit date: 10/31/2022 Discharge date: 11/01/2022  Admission Diagnoses: Large Volume Bladder Cancer  Discharge Diagnoses:  Principal Problem:   Bladder cancer [facility])   Discharged Condition: good  Hospital Course: Pt underwent very large volume transurethral resection of bladder tumor on 10/21/22, the day of admission, without acute complication. Given there large volume of resection, he was observed overnight on slow saline bladder irrigation. By there early afternoon of POD 1, the day of discharge, he is ambulatory, pain controlled on PO meds, urine clear off irrigation, and felt to be adequate for discharge. He will keep catheter until follow-up visit next week. Hgb 14.6, Cr 0.95, path pending at discharge.  Consults: None  Significant Diagnostic Studies: labs: as per above  Treatments: surgery: as per above  Discharge Exam: Blood pressure 123/67, pulse 77, temperature 97.9 F (36.6 C), temperature source Oral, resp. rate 20, height '5\' 9"'$  (1.753 m), weight 103.9 kg, SpO2 93 %.  NAD, AOx3, sitting up in chair, at baseline, wife at bedside, both extremely pleasant Non-labored breathing on RA RRR NCAT SNTND, Stable mild obesity 3 Way foley in place with clear yellow urine off irrigation. No c/c/e.   Disposition: Discharge disposition: 01-Home or Self Care          Follow-up Information     Alexis Frock, MD Follow up on 11/06/2022.   Specialty: Urology Why: at 1:30 for MD visit, catheter removal and pathology review. Contact information: [place] [address] [phone]                 Signed: Alexis Frock 11/01/2022, 1:10 PM

## 2022-11-01 NOTE — Progress Notes (Signed)
Patient discharged home with wife, discharge instructions given and explained to patient/wife, there verbalized understanding, wife demonstrated foley management/care. Patient denies any pain/distress. No pressure injury or wound noted.  Accompanied home by wife.

## 2022-11-06 DIAGNOSIS — C678 Malignant neoplasm of overlapping sites of bladder: Secondary | ICD-10-CM | POA: Diagnosis not present

## 2022-11-13 ENCOUNTER — Other Ambulatory Visit: Payer: Self-pay | Admitting: Urology

## 2022-12-05 NOTE — Patient Instructions (Addendum)
SURGICAL WAITING ROOM VISITATION Patients having surgery or a procedure may have no more than 2 support people in the waiting area - these visitors may rotate in the visitor waiting room.   Due to an increase in RSV and influenza rates and associated hospitalizations, children ages 36 and under may not visit patients in [place]. If the patient needs to stay at the hospital during part of their recovery, the visitor guidelines for inpatient rooms apply.  PRE-OP VISITATION  Pre-op nurse will coordinate an appropriate time for 1 support person to accompany the patient in pre-op.  This support person may not rotate.  This visitor will be contacted when the time is appropriate for the visitor to come back in the pre-op area.  Please refer to the [facility] website for the visitor guidelines for Inpatients (after your surgery is over and you are in a regular room).  You are not required to quarantine at this time prior to your surgery. However, you must do this: Hand Hygiene often Do NOT share personal items Notify your provider if you are in close contact with someone who has COVID or you develop fever 100.4 or greater, new onset of sneezing, cough, sore throat, shortness of breath or body aches.  If you test positive for Covid or have been in contact with anyone that has tested positive in the last 10 days please notify you surgeon.    Your procedure is scheduled on:   Wednesday  December 12, 2022  Report to [facility] Main Entrance: [address] entrance where the [employer] is available.   Report to admitting at: 12:45   PM  +++++Call this number if you have any questions or problems the morning of surgery [phone]  DO NOT EAT OR DRINK ANYTHING AFTER MIDNIGHT THE NIGHT PRIOR TO YOUR SURGERY / PROCEDURE.   FOLLOW BOWEL PREP AND ANY ADDITIONAL PRE OP INSTRUCTIONS YOU RECEIVED FROM YOUR SURGEON'S OFFICE!!!   Oral Hygiene is also important to reduce your risk of  infection.        Remember - BRUSH YOUR TEETH THE MORNING OF SURGERY WITH YOUR REGULAR TOOTHPASTE   Take ONLY these medicines the morning of surgery with A SIP OF WATER: Amlodipine                    You may not have any metal on your body including  jewelry, and body piercing  Do not wear , lotions, powders,  cologne, or deodorant  Men may shave face and neck.  Contacts, Hearing Aids, dentures or bridgework may not be worn into surgery. DENTURES WILL BE REMOVED PRIOR TO SURGERY PLEASE DO NOT APPLY "Poly grip" OR ADHESIVES!!!  You may bring a small overnight bag with you on the day of surgery, only pack items that are not valuable.  IS NOT RESPONSIBLE   FOR VALUABLES THAT ARE LOST OR STOLEN.   Patients discharged on the day of surgery will not be allowed to drive home.  Someone NEEDS to stay with you for the first 24 hours after anesthesia.  Do not bring your home medications to the hospital. The Pharmacy will dispense medications listed on your medication list to you during your admission in the Hospital.  Special Instructions: Bring a copy of your healthcare power of attorney and living will documents the day of surgery, if you wish to have them scanned into your [facility] Records- EPIC  Please read over the following fact sheets you  were given: IF YOU HAVE QUESTIONS ABOUT YOUR PRE-OP INSTRUCTIONS, PLEASE CALL [phone]  (KAY)   [place] - Preparing for Surgery Before surgery, you can play an important role.  Because skin is not sterile, your skin needs to be as free of germs as possible.  You can reduce the number of germs on your skin by washing with CHG (chlorahexidine gluconate) soap before surgery.  CHG is an antiseptic cleaner which kills germs and bonds with the skin to continue killing germs even after washing. Please DO NOT use if you have an allergy to CHG or antibacterial soaps.  If your skin becomes reddened/irritated stop using the CHG and inform  your nurse when you arrive at Short Stay. Do not shave (including legs and underarms) for at least 48 hours prior to the first CHG shower.  You may shave your face/neck.  Please follow these instructions carefully:  1.  Shower with CHG Soap the night before surgery and the  morning of surgery.  2.  If you choose to wash your hair, wash your hair first as usual with your normal  shampoo.  3.  After you shampoo, rinse your hair and body thoroughly to remove the shampoo.                             4.  Use CHG as you would any other liquid soap.  You can apply chg directly to the skin and wash.  Gently with a scrungie or clean washcloth.  5.  Apply the CHG Soap to your body ONLY FROM THE NECK DOWN.   Do not use on face/ open                           Wound or open sores. Avoid contact with eyes, ears mouth and genitals (private parts).                       Wash face,  Genitals (private parts) with your normal soap.             6.  Wash thoroughly, paying special attention to the area where your  surgery  will be performed.  7.  Thoroughly rinse your body with warm water from the neck down.  8.  DO NOT shower/wash with your normal soap after using and rinsing off the CHG Soap.            9.  Pat yourself dry with a clean towel.            10.  Wear clean pajamas.            11.  Place clean sheets on your bed the night of your first shower and do not  sleep with pets.  ON THE DAY OF SURGERY : Do not apply any lotions/deodorants the morning of surgery.  Please wear clean clothes to the hospital/surgery center.    FAILURE TO FOLLOW THESE INSTRUCTIONS MAY RESULT IN THE CANCELLATION OF YOUR SURGERY  PATIENT SIGNATURE_________________________________  NURSE SIGNATURE__________________________________  ________________________________________________________________________

## 2022-12-05 NOTE — Progress Notes (Addendum)
COVID Vaccine received:  []  No [x]  Yes Date of any COVID positive Test in last 90 days:  None  PCP - Okey Dupre, MD at [facility] Cardiologist - none  Chest x-ray -  EKG -  10-30-2022  Epic Stress Test -  ECHO -  Cardiac Cath -   PCR screen: []  Ordered & Completed                      []   No Order but Needs PROFEND                      [x]   N/A for this surgery  Surgery Plan:  [x]  Ambulatory                            []  Outpatient in bed                            []  Admit  Anesthesia:    [x]  General  []  Spinal                           []   Choice []   MAC  Bowel Prep - []  No  []   Yes _____________  Pacemaker / ICD device [x]  No []  Yes        Device order form faxed [x]  No    []   Yes      Faxed to:  Spinal Cord Stimulator:[x]  No []  Yes      (Remind patient to bring remote DOS) Other Implants:   History of Sleep Apnea? [x]  No []  Yes   CPAP used?- [x]  No []  Yes    Does the patient monitor blood sugar? []  No []  Yes  [x]  N/A  Blood Thinner / Instructions:None Aspirin Instructions:None  ERAS Protocol Ordered: [x]  No  []  Yes Patient is to be NPO after: Midnight Prior  Activity level: Patient can climb a flight of stairs without difficulty; [x]  No CP  [x]  No SOB. Patient can perform ADLs without assistance.   Anesthesia review: HTN,  Mother slow to wake up,  no other pertinent history  Patient denies shortness of breath, fever, cough and chest pain at PAT appointment.  Patient verbalized understanding and agreement to the Pre-Surgical Instructions that were given to them at this PAT appointment. Patient was also educated of the need to review these PAT instructions again prior to his/her surgery.I reviewed the appropriate phone numbers to call if they have any and questions or concerns.

## 2022-12-07 ENCOUNTER — Encounter (HOSPITAL_COMMUNITY): Payer: Self-pay

## 2022-12-07 ENCOUNTER — Encounter (HOSPITAL_COMMUNITY)
Admission: RE | Admit: 2022-12-07 | Discharge: 2022-12-07 | Disposition: A | Discharge status: Home / Self Care | Payer: Medicare Other | Source: Ambulatory Visit | Attending: Urology | Admitting: Urology

## 2022-12-07 ENCOUNTER — Other Ambulatory Visit: Payer: Self-pay

## 2022-12-07 VITALS — BP 134/75 | HR 80 | Temp 97.7°F | Resp 16 | Ht 69.5 in | Wt 224.0 lb

## 2022-12-07 DIAGNOSIS — Z01812 Encounter for preprocedural laboratory examination: Secondary | ICD-10-CM | POA: Diagnosis not present

## 2022-12-07 DIAGNOSIS — I10 Essential (primary) hypertension: Secondary | ICD-10-CM | POA: Insufficient documentation

## 2022-12-07 LAB — BASIC METABOLIC PANEL
Anion gap: 11 (ref 5–15)
BUN: 15 mg/dL (ref 8–23)
CO2: 23 mmol/L (ref 22–32)
Calcium: 9 mg/dL (ref 8.9–10.3)
Chloride: 104 mmol/L (ref 98–111)
Creatinine, Ser: 0.93 mg/dL (ref 0.61–1.24)
GFR, Estimated: >60 mL/min (ref >60)
Glucose, Bld: 100 mg/dL — ABNORMAL HIGH (ref 70–99)
Potassium: 4.1 mmol/L (ref 3.5–5.1)
Sodium: 138 mmol/L (ref 135–145)

## 2022-12-07 LAB — CBC
HCT: 45.6 % (ref 39.0–52.0)
Hemoglobin: 15.9 g/dL (ref 13.0–17.0)
MCH: 32.5 pg (ref 26.0–34.0)
MCHC: 34.9 g/dL (ref 30.0–36.0)
MCV: 93.3 fL (ref 80.0–100.0)
Platelets: 262 10*3/uL (ref 150–400)
RBC: 4.89 MIL/uL (ref 4.22–5.81)
RDW: 13.1 % (ref 11.5–15.5)
WBC: 10.2 10*3/uL (ref 4.0–10.5)
nRBC: 0 % (ref 0.0–0.2)

## 2022-12-12 ENCOUNTER — Ambulatory Visit (HOSPITAL_BASED_OUTPATIENT_CLINIC_OR_DEPARTMENT_OTHER): Payer: Medicare Other | Admitting: Certified Registered"

## 2022-12-12 ENCOUNTER — Ambulatory Visit (HOSPITAL_COMMUNITY): Payer: Medicare Other | Admitting: Certified Registered"

## 2022-12-12 ENCOUNTER — Encounter (HOSPITAL_COMMUNITY): Payer: Self-pay | Admitting: Urology

## 2022-12-12 ENCOUNTER — Encounter (HOSPITAL_COMMUNITY)
Admission: RE | Disposition: A | Discharge status: Home / Self Care | Payer: Self-pay | Source: Home / Self Care | Attending: Urology

## 2022-12-12 ENCOUNTER — Ambulatory Visit (HOSPITAL_COMMUNITY)
Admission: RE | Admit: 2022-12-12 | Discharge: 2022-12-12 | Disposition: A | Discharge status: Home / Self Care | Payer: Medicare Other | Attending: Urology | Admitting: Urology

## 2022-12-12 ENCOUNTER — Ambulatory Visit (HOSPITAL_COMMUNITY): Payer: Medicare Other

## 2022-12-12 ENCOUNTER — Other Ambulatory Visit: Payer: Self-pay

## 2022-12-12 DIAGNOSIS — D303 Benign neoplasm of bladder: Secondary | ICD-10-CM | POA: Insufficient documentation

## 2022-12-12 DIAGNOSIS — N3289 Other specified disorders of bladder: Secondary | ICD-10-CM | POA: Diagnosis not present

## 2022-12-12 DIAGNOSIS — E669 Obesity, unspecified: Secondary | ICD-10-CM | POA: Diagnosis not present

## 2022-12-12 DIAGNOSIS — R310 Gross hematuria: Secondary | ICD-10-CM | POA: Diagnosis not present

## 2022-12-12 DIAGNOSIS — Z6832 Body mass index (BMI) 32.0-32.9, adult: Secondary | ICD-10-CM | POA: Insufficient documentation

## 2022-12-12 DIAGNOSIS — I10 Essential (primary) hypertension: Secondary | ICD-10-CM | POA: Insufficient documentation

## 2022-12-12 DIAGNOSIS — Z8551 Personal history of malignant neoplasm of bladder: Secondary | ICD-10-CM

## 2022-12-12 DIAGNOSIS — N3020 Other chronic cystitis without hematuria: Secondary | ICD-10-CM | POA: Diagnosis not present

## 2022-12-12 DIAGNOSIS — C679 Malignant neoplasm of bladder, unspecified: Secondary | ICD-10-CM | POA: Diagnosis present

## 2022-12-12 DIAGNOSIS — Z87891 Personal history of nicotine dependence: Secondary | ICD-10-CM | POA: Insufficient documentation

## 2022-12-12 HISTORY — PX: CYSTOSCOPY W/ RETROGRADES: SHX1426

## 2022-12-12 HISTORY — PX: TRANSURETHRAL RESECTION OF BLADDER TUMOR: SHX2575

## 2022-12-12 SURGERY — TURBT (TRANSURETHRAL RESECTION OF BLADDER TUMOR)
Anesthesia: General | Site: Urethra

## 2022-12-12 MED ORDER — SODIUM CHLORIDE 0.9 % IV SOLN: INTRAVENOUS | Status: DC | PRN

## 2022-12-12 MED ORDER — GENTAMICIN SULFATE 40 MG/ML IJ SOLN
INTRAVENOUS | Status: DC | PRN
  Administered 2022-12-12: 500 mg via INTRAVENOUS

## 2022-12-12 MED ORDER — CHLORHEXIDINE GLUCONATE 0.12 % MT SOLN
15.0000 mL | Freq: Once | OROMUCOSAL | Status: AC
  Administered 2022-12-12: 15 mL via OROMUCOSAL

## 2022-12-12 MED ORDER — SODIUM CHLORIDE 0.9 % IR SOLN
Status: DC | PRN
  Administered 2022-12-12: 9000 mL via INTRAVESICAL

## 2022-12-12 MED ORDER — FENTANYL CITRATE PF 50 MCG/ML IJ SOSY: 25.0000 ug | PREFILLED_SYRINGE | INTRAMUSCULAR | Status: DC | PRN

## 2022-12-12 MED ORDER — ONDANSETRON HCL 4 MG/2ML IJ SOLN: 4.0000 mg | Freq: Once | INTRAMUSCULAR | Status: DC | PRN

## 2022-12-12 MED ORDER — PROPOFOL 10 MG/ML IV BOLUS
INTRAVENOUS | Status: AC
  Filled 2022-12-12: qty 20

## 2022-12-12 MED ORDER — ONDANSETRON HCL 4 MG/2ML IJ SOLN
INTRAMUSCULAR | Status: AC
  Filled 2022-12-12: qty 2

## 2022-12-12 MED ORDER — LIDOCAINE 2% (20 MG/ML) 5 ML SYRINGE
INTRAMUSCULAR | Status: DC | PRN
  Administered 2022-12-12: 60 mg via INTRAVENOUS

## 2022-12-12 MED ORDER — LACTATED RINGERS IV SOLN: INTRAVENOUS | Status: DC

## 2022-12-12 MED ORDER — FENTANYL CITRATE (PF) 100 MCG/2ML IJ SOLN
INTRAMUSCULAR | Status: AC
  Filled 2022-12-12: qty 2

## 2022-12-12 MED ORDER — GENTAMICIN SULFATE 40 MG/ML IJ SOLN
500.0000 mg | INTRAVENOUS | Status: DC
  Filled 2022-12-12: qty 12.5

## 2022-12-12 MED ORDER — PHENYLEPHRINE HCL (PRESSORS) 10 MG/ML IV SOLN: INTRAVENOUS | Status: DC | PRN

## 2022-12-12 MED ORDER — IOHEXOL 300 MG/ML  SOLN
INTRAMUSCULAR | Status: DC | PRN
  Administered 2022-12-12: 10 mL via URETHRAL

## 2022-12-12 MED ORDER — MIDAZOLAM HCL 2 MG/2ML IJ SOLN
INTRAMUSCULAR | Status: AC
  Filled 2022-12-12: qty 2

## 2022-12-12 MED ORDER — FENTANYL CITRATE (PF) 250 MCG/5ML IJ SOLN
INTRAMUSCULAR | Status: DC | PRN
  Administered 2022-12-12 (×2): 50 ug via INTRAVENOUS

## 2022-12-12 MED ORDER — DEXAMETHASONE SODIUM PHOSPHATE 10 MG/ML IJ SOLN
INTRAMUSCULAR | Status: DC | PRN
  Administered 2022-12-12: 10 mg via INTRAVENOUS

## 2022-12-12 MED ORDER — DEXAMETHASONE SODIUM PHOSPHATE 10 MG/ML IJ SOLN
INTRAMUSCULAR | Status: AC
  Filled 2022-12-12: qty 1

## 2022-12-12 MED ORDER — ONDANSETRON HCL 4 MG/2ML IJ SOLN
INTRAMUSCULAR | Status: DC | PRN
  Administered 2022-12-12: 4 mg via INTRAVENOUS

## 2022-12-12 MED ORDER — OXYCODONE HCL 5 MG PO TABS: 5.0000 mg | ORAL_TABLET | Freq: Once | ORAL | Status: DC | PRN

## 2022-12-12 MED ORDER — PROPOFOL 10 MG/ML IV BOLUS
INTRAVENOUS | Status: DC | PRN
  Administered 2022-12-12: 200 mg via INTRAVENOUS

## 2022-12-12 MED ORDER — OXYCODONE HCL 5 MG/5ML PO SOLN: 5.0000 mg | Freq: Once | ORAL | Status: DC | PRN

## 2022-12-12 MED ORDER — MIDAZOLAM HCL 5 MG/5ML IJ SOLN
INTRAMUSCULAR | Status: DC | PRN
  Administered 2022-12-12: 2 mg via INTRAVENOUS

## 2022-12-12 MED ORDER — ORAL CARE MOUTH RINSE: 15.0000 mL | Freq: Once | OROMUCOSAL | Status: AC

## 2022-12-12 MED ORDER — ACETAMINOPHEN 10 MG/ML IV SOLN: 1000.0000 mg | Freq: Once | INTRAVENOUS | Status: DC | PRN

## 2022-12-12 SURGICAL SUPPLY — 25 items
BAG COUNTER SPONGE SURGICOUNT (BAG) IMPLANT
BAG DRN RND TRDRP ANRFLXCHMBR (UROLOGICAL SUPPLIES)
BAG SPNG CNTER NS LX DISP (BAG)
BAG URINE DRAIN 2000ML AR STRL (UROLOGICAL SUPPLIES) IMPLANT
BAG URO CATCHER STRL LF (MISCELLANEOUS) ×2 IMPLANT
CATH URETL OPEN END 6FR 70 (CATHETERS) IMPLANT
CLOTH BEACON ORANGE TIMEOUT ST (SAFETY) ×2 IMPLANT
DRAPE FOOT SWITCH (DRAPES) ×2 IMPLANT
ELECT REM PT RETURN 15FT ADLT (MISCELLANEOUS) ×2 IMPLANT
EVACUATOR MICROVAS BLADDER (UROLOGICAL SUPPLIES) IMPLANT
GLOVE SURG LX STRL 7.5 STRW (GLOVE) ×2 IMPLANT
GOWN SRG XL LVL 4 BRTHBL STRL (GOWNS) ×2 IMPLANT
GOWN STRL NON-REIN XL LVL4 (GOWNS) ×2
GOWN STRL REUS W/ TWL XL LVL3 (GOWN DISPOSABLE) ×2 IMPLANT
GOWN STRL REUS W/TWL XL LVL3 (GOWN DISPOSABLE) ×2
GUIDEWIRE STR DUAL SENSOR (WIRE) ×2 IMPLANT
KIT TURNOVER KIT A (KITS) IMPLANT
LOOP CUT BIPOLAR 24F LRG (ELECTROSURGICAL) IMPLANT
MANIFOLD NEPTUNE II (INSTRUMENTS) ×2 IMPLANT
NS IRRIG 1000ML POUR BTL (IV SOLUTION) IMPLANT
PACK CYSTO (CUSTOM PROCEDURE TRAY) ×2 IMPLANT
SYR TOOMEY IRRIG 70ML (MISCELLANEOUS)
SYRINGE TOOMEY IRRIG 70ML (MISCELLANEOUS) IMPLANT
TUBING CONNECTING 10 (TUBING) ×2 IMPLANT
TUBING UROLOGY SET (TUBING) ×2 IMPLANT

## 2022-12-12 NOTE — Anesthesia Procedure Notes (Signed)
Procedure Name: LMA Insertion Date/Time: 12/12/2022 4:07 PM  Performed by: Randye Lobo, CRNAPre-anesthesia Checklist: Patient identified, Emergency Drugs available, Suction available and Patient being monitored Patient Re-evaluated:Patient Re-evaluated prior to induction Oxygen Delivery Method: Circle System Utilized Preoxygenation: Pre-oxygenation with 100% oxygen Induction Type: IV induction Ventilation: Mask ventilation without difficulty LMA: LMA inserted LMA Size: 5.0 Number of attempts: 1 Airway Equipment and Method: Bite block Placement Confirmation: positive ETCO2 Tube secured with: Tape Dental Injury: Teeth and Oropharynx as per pre-operative assessment

## 2022-12-12 NOTE — H&P (Signed)
George Roberts is an 72 y.o. male.    Chief Complaint: Pre-OP Restaging Transurethral Resection of Bladder Tumor / Retrogrades  HPI:   1 - Large Volume HIgh Grade Bladder Cancer - >7cm multifocal papillary tumors on Ct and office cysto 10/2022 on eval hematuria. Remote >60PY smoker.   Recent Course:  10/2022 - T1G3 with negative muscle at TURBT (estimated 90% tumor removed, ?some still at anterior bladder neck).   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 Significant mass effect and medial deviation of Rt ureter. Mass effect may make future urinary diversion problematic.   3 - Prostate Screening - PSA 3.37 2023 at age 72==> STOP PSA based screening   4 - Lower Urinary Tract Symptoms - on tamsulosin at baseline. Prostate 70gm by CT ellipsoid calculation 2023.   PMH sig for obesity, segmental open colon resection for large polyp (follows Q2 year colonoscopy with GI), no ischemic CV disease / blood thinners. 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 His PCP is JOkey DupreMD.   Today " George Roberts" is seen to proceed with restaging TURBT for very lage bladder cancer. No interval fevers.   Past Medical History:  Diagnosis Date   Family history of adverse reaction to anesthesia    mother--- slow to wake   Hypertension    followed by pcp--- pt stated has nuclear stress test approx 2012 told normal   OA (osteoarthritis)    Wears dentures    upper    Past Surgical History:  Procedure Laterality Date   CARPAL TUNNEL RELEASE Right 08/31/2021   Procedure: CARPAL TUNNEL RELEASE;  Surgeon: SOrene Desanctis MD;  [place]  Service: Orthopedics;  Laterality: Right;  with local anesthesia   COLONOSCOPY  2019   CYSTOSCOPY W/ RETROGRADES Bilateral 10/31/2022   Procedure: CYSTOSCOPY WITH RETROGRADE PYELOGRAM;  Surgeon: MAlexis Frock MD;  [facility];  Service: Urology;  Laterality:  Bilateral;   HAND TENDON SURGERY Left 1998   approx   TONSILLECTOMY     TOTAL HIP ARTHROPLASTY  10/03/2011   Procedure: TOTAL HIP ARTHROPLASTY;  Surgeon: FDione PloverAluisio;  [facility];  Service: Orthopedics;  Laterality: Left;   TRANSURETHRAL RESECTION OF BLADDER TUMOR N/A 10/31/2022   Procedure: TRANSURETHRAL RESECTION OF BLADDER TUMOR (TURBT);  Surgeon: MAlexis Frock MD;  [facility];  Service: Urology;  Laterality: N/A;  2 HRS    No family history on file. Social History:  reports that he quit smoking about 32 years ago. His smoking use included cigarettes. He does not have any smokeless tobacco history on file. He reports current alcohol use. He reports that he does not use drugs.  Allergies: No Known Allergies  No medications prior to admission.    No results found for this or any previous visit (from the past 48 hour(s)). No results found.  Review of Systems  Constitutional:  Negative for chills and fever.  All other systems reviewed and are negative.   There were no vitals taken for this visit. Physical Exam Vitals reviewed.  HENT:     Head: Normocephalic.  Eyes:     Pupils: Pupils are equal, round, and reactive to light.  Cardiovascular:     Rate and Rhythm: Normal rate.  Pulmonary:     Effort: Pulmonary effort is normal.  Abdominal:     General: Abdomen is flat.  Genitourinary:  Comments: No CVAT at present Musculoskeletal:        General: Normal range of motion.     Cervical back: Normal range of motion.  Skin:    General: Skin is warm.  Neurological:     General: No focal deficit present.     Mental Status: He is alert.  Psychiatric:        Mood and Affect: Mood normal.      Assessment/Plan  Proceed as planned with cysto, bilateral retrogrades, and re-staging TURBT for very large volume high grade bladder cancer. Risks, benefits, alternatives, expected peri-op course discussed previousliy and reiterated today including need for  possible temporary post-op catheter given large surface area involved.   Alexis Frock, MD 12/12/2022, 6:48 AM

## 2022-12-12 NOTE — Transfer of Care (Signed)
Immediate Anesthesia Transfer of Care Note  Patient: George Roberts  Procedure(s) Performed: TRANSURETHRAL RESECTION OF BLADDER TUMOR (TURBT) (Urethra) CYSTOSCOPY WITH RETROGRADE PYELOGRAM (Bilateral: Urethra)  Patient Location: PACU  Anesthesia Type:General  Level of Consciousness: awake, alert , and oriented  Airway & Oxygen Therapy: Patient Spontanous Breathing and Patient connected to face mask oxygen  Post-op Assessment: Report given to RN and Post -op Vital signs reviewed and stable  Post vital signs: Reviewed and stable  Last Vitals:  Vitals Value Taken Time  BP 156/89   Temp    Pulse 90 12/12/22 1652  Resp 19 12/12/22 1652  SpO2 94 % 12/12/22 1652  Vitals shown include unvalidated device data.  Last Pain:  Vitals:   12/12/22 1330  TempSrc:   PainSc: 0-No pain         Complications: No notable events documented.

## 2022-12-12 NOTE — Anesthesia Postprocedure Evaluation (Signed)
Anesthesia Post Note  Patient: George Roberts  Procedure(s) Performed: TRANSURETHRAL RESECTION OF BLADDER TUMOR (TURBT) (Urethra) CYSTOSCOPY WITH RETROGRADE PYELOGRAM (Bilateral: Urethra)     Patient location during evaluation: PACU Anesthesia Type: General Level of consciousness: awake and alert Pain management: pain level controlled Vital Signs Assessment: post-procedure vital signs reviewed and stable Respiratory status: spontaneous breathing, nonlabored ventilation and respiratory function stable Cardiovascular status: blood pressure returned to baseline and stable Postop Assessment: no apparent nausea or vomiting Anesthetic complications: no   No notable events documented.  Last Vitals:  Vitals:   12/12/22 1715 12/12/22 1730  BP: (!) 150/87 (!) 149/86  Pulse: 88 89  Resp: 18 16  Temp:  36.7 C  SpO2: 92% 92%    Last Pain:  Vitals:   12/12/22 1700  TempSrc:   PainSc: 0-No pain                 Lynda Rainwater

## 2022-12-12 NOTE — Brief Op Note (Signed)
12/12/2022  4:37 PM  PATIENT:  Glorious Peach  72 y.o. male  PRE-OPERATIVE DIAGNOSIS:  BLADDER CANCER  POST-OPERATIVE DIAGNOSIS:  BLADDER CANCER  PROCEDURE:  Procedure(s) with comments: TRANSURETHRAL RESECTION OF BLADDER TUMOR (TURBT) (N/A) - 12 MINS CYSTOSCOPY WITH RETROGRADE PYELOGRAM (Bilateral)  SURGEON:  Surgeon(s) and Role:    * Alexis Frock, MD - Primary  PHYSICIAN ASSISTANT:   ASSISTANTS: none   ANESTHESIA:   general  EBL:  minimal   BLOOD ADMINISTERED:none  DRAINS: none   LOCAL MEDICATIONS USED:  NONE  SPECIMEN:  Source of Specimen:  1 - old resection site; 2 - base of old reseciton site  DISPOSITION OF SPECIMEN:  PATHOLOGY  COUNTS:  YES  TOURNIQUET:  * No tourniquets in log *  DICTATION: .Other Dictation: Dictation Number [id number]  PLAN OF CARE: Discharge to home after PACU  PATIENT DISPOSITION:  PACU - hemodynamically stable.   Delay start of Pharmacological VTE agent (>24hrs) due to surgical blood loss or risk of bleeding: yes

## 2022-12-12 NOTE — Discharge Instructions (Signed)
1 - You may have urinary urgency (bladder spasms) and bloody urine on / off for up to 2 weeks.  This is normal.  2 - Call MD or go to ER for fever >102, severe pain / nausea / vomiting not relieved by medications, or acute change in medical status  

## 2022-12-12 NOTE — Anesthesia Preprocedure Evaluation (Addendum)
Anesthesia Evaluation  Patient identified by MRN, date of birth, ID band Patient awake    Reviewed: Allergy & Precautions, H&P , NPO status , Patient's Chart, lab work & pertinent test results  Airway Mallampati: II  TM Distance: <3 FB Neck ROM: Full    Dental  (+) Edentulous Upper   Pulmonary former smoker   Pulmonary exam normal breath sounds clear to auscultation       Cardiovascular hypertension, Pt. on medications Normal cardiovascular exam Rhythm:Regular Rate:Normal     Neuro/Psych negative neurological ROS  negative psych ROS   GI/Hepatic negative GI ROS, Neg liver ROS,,,  Endo/Other  negative endocrine ROS    Renal/GU negative Renal ROS  negative genitourinary   Musculoskeletal  (+) Arthritis , Osteoarthritis,    Abdominal  (+) + obese  Peds negative pediatric ROS (+)  Hematology negative hematology ROS (+)   Anesthesia Other Findings   Reproductive/Obstetrics negative OB ROS                             Anesthesia Physical Anesthesia Plan  ASA: 2  Anesthesia Plan: General   Post-op Pain Management: Minimal or no pain anticipated   Induction: Intravenous  PONV Risk Score and Plan: 2 and Ondansetron, Treatment may vary due to age or medical condition and Midazolam  Airway Management Planned: LMA  Additional Equipment:   Intra-op Plan:   Post-operative Plan: Extubation in OR  Informed Consent: I have reviewed the patients History and Physical, chart, labs and discussed the procedure including the risks, benefits and alternatives for the proposed anesthesia with the patient or authorized representative who has indicated his/her understanding and acceptance.     Dental advisory given  Plan Discussed with: CRNA and Surgeon  Anesthesia Plan Comments:        Anesthesia Quick Evaluation

## 2022-12-12 NOTE — Op Note (Signed)
NAME: George Roberts, LANGER MEDICAL RECORD NO: [id number] ACCOUNT NO: [id number] DATE OF BIRTH: 1951-06-25 FACILITY: [facility] LOCATION: [facility] PHYSICIAN: Alexis Frock, MD  Operative Report   DATE OF PROCEDURE: 12/12/2022  PREOPERATIVE DIAGNOSIS:  History of large volume bladder cancer.  PROCEDURES PERFORMED: 1.  Transurethral resection of bladder tumor, volume medium. 2.  Bilateral retrograde pyelograms with interpretation.  ESTIMATED BLOOD LOSS:  Nil.  COMPLICATIONS:  None.  SPECIMENS:  1.  Old resection site. 2.  Base of old resection site.  FINDINGS:  1.  No evidence of residual or rapidly recurrent papillary tumor. 2.  Old resection sites noted lateral to the right ureteral orifice and mid bladder base. 3.  Very large median lobe as per prior. 4.  Unremarkable right retrograde pyelogram. 5.  Limited left retrograde pyelogram.  INDICATIONS:  The patient is a very pleasant 72 year old man who was found on workup for hematuria to have a massive volume bladder cancer.  He was referred initially for consideration of upfront cystectomy.  He also has a very large likely fatty tumor  of the right psoas musculature with medial deviation of the right ureter and history of prior intra-abdominal surgery.  Options were discussed including recommended path of transurethral resection for maximum endoscopic management.  He wished to proceed.   He underwent transurethral resection of bladder tumor several weeks ago and it is estimated that nearly all of the bladder tumor was resected successfully.  Unfortunately, he was found to have non-muscle invasive disease.  This was amazingly fortunate.  Given the large volume of tumor and high-grade nature, it was felt that restaging resection was clearly warranted to maximally rule out muscle invasive disease rapidly recurrent disease.  He presents for this today.  Informed consent was obtained and  placed in medical record.  PROCEDURE DETAILS:  The  patient being [name] identified and verified, procedure being transurethral resection of bladder tumor with bilateral retrograde pyelograms was confirmed.  Procedure timeout was performed.  Intravenous antibiotics were  administered.  General LMA anesthesia was induced.  The patient was placed into a low lithotomy position.  Sterile field was created, prepped and draped the patient's penis, perineum, and proximal thighs using iodine.  Cystourethroscopy was performed  using 21-French rigid cystoscope with offset lens.  Inspection of anterior and posterior urethra did reveal a very large median lobe of the prostate as per prior. Inspection of urinary bladder revealed old resection site just lateral to the right ureteral  orifice and another resection site posterior base.  No evidence of rapidly recurrent or residual tumor and it was quite favorable.  Attention was directed at retrograde pyelograms. Left ureteral orifice was cannulated with 21-French catheter and left  retrograde pyelogram was obtained.  Left retrograde pyelogram demonstrated severe J-hooking of the left ureter and it was quite difficult to have contrast traverse higher then approximately the mid ureter; however, this was unremarkable.  He had a reassuring retrograde recently.  Next,  right retrograde pyelogram was obtained.  Right retrograde pyelogram demonstrated single right ureter, single system right kidney.  There was medial deviation of right ureter as expected given his large retroperitoneal fatty tumor.  Next cystoscope was exchanged for the 26-French resectoscope  sheath with visual obturator and using resectoscope loop, a very careful resection was performed of the old resection site lateral to the right ureteral orifice and the other posterior base.  Total surface area approximately 4 cm2.  This tissue was set  aside for permanent pathology, labeled as old  resection site.  Next, cold cup biopsy forceps were used to obtain  representative seromuscular samples deep to this to rule out muscle invasive disease.  These were set aside, labeled as base of old resection  site.  Next, a coagulation current was used to completely fulgurate the area of resection and an approximately 1 cm rim lateral to this with great care to avoid any direct injury to ureteral orifice, which did not occur.  I was quite happy with the  completeness and the safety of the resection with his very large median lobe.  There apparently was some bleeding from the mucosal edges of this.  This was very carefully coagulated in a descending spiral fashion as the patient adamantly wants to try to  avoid catheterization postoperatively.  We purposely left his bladder partially full and the procedure was terminated.  The patient tolerated the procedure well.  There were no immediate perioperative complications.  The patient was taken to the  postanesthesia care unit in stable condition.  Plan for discharge home after void.   MUK D: 12/12/2022 4:42:33 pm T: 12/12/2022 8:29:00 pm  JOB: [id number]

## 2022-12-13 ENCOUNTER — Encounter (HOSPITAL_COMMUNITY): Payer: Self-pay | Admitting: Urology

## 2022-12-14 LAB — SURGICAL PATHOLOGY

## 2022-12-25 DIAGNOSIS — R310 Gross hematuria: Secondary | ICD-10-CM | POA: Diagnosis not present

## 2022-12-25 DIAGNOSIS — C678 Malignant neoplasm of overlapping sites of bladder: Secondary | ICD-10-CM | POA: Diagnosis not present

## 2022-12-25 DIAGNOSIS — D175 Benign lipomatous neoplasm of intra-abdominal organs: Secondary | ICD-10-CM | POA: Diagnosis not present

## 2023-01-04 DIAGNOSIS — Z5111 Encounter for antineoplastic chemotherapy: Secondary | ICD-10-CM | POA: Diagnosis not present

## 2023-01-04 DIAGNOSIS — C678 Malignant neoplasm of overlapping sites of bladder: Secondary | ICD-10-CM | POA: Diagnosis not present

## 2023-01-11 DIAGNOSIS — R8271 Bacteriuria: Secondary | ICD-10-CM | POA: Diagnosis not present

## 2023-01-11 DIAGNOSIS — Z5111 Encounter for antineoplastic chemotherapy: Secondary | ICD-10-CM | POA: Diagnosis not present

## 2023-01-11 DIAGNOSIS — C678 Malignant neoplasm of overlapping sites of bladder: Secondary | ICD-10-CM | POA: Diagnosis not present

## 2023-01-18 DIAGNOSIS — R8271 Bacteriuria: Secondary | ICD-10-CM | POA: Diagnosis not present

## 2023-01-18 DIAGNOSIS — C678 Malignant neoplasm of overlapping sites of bladder: Secondary | ICD-10-CM | POA: Diagnosis not present

## 2023-01-25 DIAGNOSIS — Z5111 Encounter for antineoplastic chemotherapy: Secondary | ICD-10-CM | POA: Diagnosis not present

## 2023-01-25 DIAGNOSIS — C678 Malignant neoplasm of overlapping sites of bladder: Secondary | ICD-10-CM | POA: Diagnosis not present

## 2023-02-08 DIAGNOSIS — C678 Malignant neoplasm of overlapping sites of bladder: Secondary | ICD-10-CM | POA: Diagnosis not present

## 2023-02-08 DIAGNOSIS — Z5111 Encounter for antineoplastic chemotherapy: Secondary | ICD-10-CM | POA: Diagnosis not present

## 2023-02-15 DIAGNOSIS — L570 Actinic keratosis: Secondary | ICD-10-CM | POA: Diagnosis not present

## 2023-02-15 DIAGNOSIS — D3617 Benign neoplasm of peripheral nerves and autonomic nervous system of trunk, unspecified: Secondary | ICD-10-CM | POA: Diagnosis not present

## 2023-02-15 DIAGNOSIS — Z85828 Personal history of other malignant neoplasm of skin: Secondary | ICD-10-CM | POA: Diagnosis not present

## 2023-02-15 DIAGNOSIS — L821 Other seborrheic keratosis: Secondary | ICD-10-CM | POA: Diagnosis not present

## 2023-02-15 DIAGNOSIS — C4441 Basal cell carcinoma of skin of scalp and neck: Secondary | ICD-10-CM | POA: Diagnosis not present

## 2023-02-15 DIAGNOSIS — Z5111 Encounter for antineoplastic chemotherapy: Secondary | ICD-10-CM | POA: Diagnosis not present

## 2023-02-15 DIAGNOSIS — L918 Other hypertrophic disorders of the skin: Secondary | ICD-10-CM | POA: Diagnosis not present

## 2023-02-15 DIAGNOSIS — C678 Malignant neoplasm of overlapping sites of bladder: Secondary | ICD-10-CM | POA: Diagnosis not present

## 2023-02-15 DIAGNOSIS — D225 Melanocytic nevi of trunk: Secondary | ICD-10-CM | POA: Diagnosis not present

## 2023-02-15 DIAGNOSIS — D485 Neoplasm of uncertain behavior of skin: Secondary | ICD-10-CM | POA: Diagnosis not present

## 2023-02-15 DIAGNOSIS — L718 Other rosacea: Secondary | ICD-10-CM | POA: Diagnosis not present

## 2023-02-22 DIAGNOSIS — Z5111 Encounter for antineoplastic chemotherapy: Secondary | ICD-10-CM | POA: Diagnosis not present

## 2023-02-22 DIAGNOSIS — C678 Malignant neoplasm of overlapping sites of bladder: Secondary | ICD-10-CM | POA: Diagnosis not present

## 2023-04-15 DIAGNOSIS — C678 Malignant neoplasm of overlapping sites of bladder: Secondary | ICD-10-CM | POA: Diagnosis not present

## 2023-04-15 DIAGNOSIS — D175 Benign lipomatous neoplasm of intra-abdominal organs: Secondary | ICD-10-CM | POA: Diagnosis not present

## 2023-04-24 DIAGNOSIS — H1045 Other chronic allergic conjunctivitis: Secondary | ICD-10-CM | POA: Diagnosis not present

## 2023-04-24 DIAGNOSIS — H2513 Age-related nuclear cataract, bilateral: Secondary | ICD-10-CM | POA: Diagnosis not present

## 2023-07-16 DIAGNOSIS — C678 Malignant neoplasm of overlapping sites of bladder: Secondary | ICD-10-CM | POA: Diagnosis not present

## 2023-07-18 ENCOUNTER — Other Ambulatory Visit: Payer: Self-pay | Admitting: Urology

## 2023-07-27 DIAGNOSIS — Z23 Encounter for immunization: Secondary | ICD-10-CM | POA: Diagnosis not present

## 2023-08-01 DIAGNOSIS — N529 Male erectile dysfunction, unspecified: Secondary | ICD-10-CM | POA: Diagnosis not present

## 2023-08-01 DIAGNOSIS — Z0000 Encounter for general adult medical examination without abnormal findings: Secondary | ICD-10-CM | POA: Diagnosis not present

## 2023-08-01 DIAGNOSIS — Z125 Encounter for screening for malignant neoplasm of prostate: Secondary | ICD-10-CM | POA: Diagnosis not present

## 2023-08-01 DIAGNOSIS — Z79899 Other long term (current) drug therapy: Secondary | ICD-10-CM | POA: Diagnosis not present

## 2023-08-01 DIAGNOSIS — N401 Enlarged prostate with lower urinary tract symptoms: Secondary | ICD-10-CM | POA: Diagnosis not present

## 2023-08-01 DIAGNOSIS — C679 Malignant neoplasm of bladder, unspecified: Secondary | ICD-10-CM | POA: Diagnosis not present

## 2023-08-01 DIAGNOSIS — I10 Essential (primary) hypertension: Secondary | ICD-10-CM | POA: Diagnosis not present

## 2023-08-01 DIAGNOSIS — Z86010 Personal history of colonic polyps: Secondary | ICD-10-CM | POA: Diagnosis not present

## 2023-08-01 DIAGNOSIS — Z1211 Encounter for screening for malignant neoplasm of colon: Secondary | ICD-10-CM | POA: Diagnosis not present

## 2023-08-01 DIAGNOSIS — M62830 Muscle spasm of back: Secondary | ICD-10-CM | POA: Diagnosis not present

## 2023-08-01 DIAGNOSIS — E7800 Pure hypercholesterolemia, unspecified: Secondary | ICD-10-CM | POA: Diagnosis not present

## 2023-08-01 DIAGNOSIS — Z1331 Encounter for screening for depression: Secondary | ICD-10-CM | POA: Diagnosis not present

## 2023-08-01 DIAGNOSIS — Z122 Encounter for screening for malignant neoplasm of respiratory organs: Secondary | ICD-10-CM | POA: Diagnosis not present

## 2023-08-01 DIAGNOSIS — Z980 Intestinal bypass and anastomosis status: Secondary | ICD-10-CM | POA: Diagnosis not present

## 2023-08-01 NOTE — Patient Instructions (Addendum)
SURGICAL WAITING ROOM VISITATION Patients having surgery or a procedure may have no more than 2 support people in the waiting area - these visitors may rotate in the visitor waiting room.   Due to an increase in RSV and influenza rates and associated hospitalizations, children ages 39 and under may not visit patients in [facility] hospitals. If the patient needs to stay at the hospital during part of their recovery, the visitor guidelines for inpatient rooms apply.  PRE-OP VISITATION  Pre-op nurse will coordinate an appropriate time for 1 support person to accompany the patient in pre-op.  This support person may not rotate.  This visitor will be contacted when the time is appropriate for the visitor to come back in the pre-op area.  Please refer to the [facility] website for the visitor guidelines for Inpatients (after your surgery is over and you are in a regular room).  You are not required to quarantine at this time prior to your surgery. However, you must do this: Hand Hygiene often Do NOT share personal items Notify your provider if you are in close contact with someone who has COVID or you develop fever 100.4 or greater, new onset of sneezing, cough, sore throat, shortness of breath or body aches.  If you test positive for Covid or have been in contact with anyone that has tested positive in the last 10 days please notify you surgeon.    Your procedure is scheduled on:  FRIDAY  August 09, 2023  Report to [facility] Main Entrance: [facility] entrance where the [employer] is available.   Report to admitting at: 11:15    AM  Call this number if you have any questions or problems the morning of surgery [phone]  DO NOT EAT OR DRINK ANYTHING AFTER MIDNIGHT THE NIGHT PRIOR TO YOUR SURGERY / PROCEDURE.   FOLLOW BOWEL PREP AND ANY ADDITIONAL PRE OP INSTRUCTIONS YOU RECEIVED FROM YOUR SURGEON'S OFFICE!!!    Oral Hygiene is also important to reduce your risk of infection.         Remember - BRUSH YOUR TEETH THE MORNING OF SURGERY WITH YOUR REGULAR TOOTHPASTE  Do NOT smoke after Midnight the night before surgery.  STOP TAKING all Vitamins, Herbs and supplements 1 week before your surgery.   Take ONLY these medicines the morning of surgery with A SIP OF WATER: amlodipine                   You may not have any metal on your body including  jewelry, and body piercing  Do not wear  lotions, powders, cologne, or deodorant  Men may shave face and neck.  Contacts, Hearing Aids, dentures or bridgework may not be worn into surgery. DENTURES WILL BE REMOVED PRIOR TO SURGERY PLEASE DO NOT APPLY "Poly grip" OR ADHESIVES!!!  You may bring a small overnight bag with you on the day of surgery, only pack items that are not valuable. [facility] IS NOT RESPONSIBLE   FOR VALUABLES THAT ARE LOST OR STOLEN.   Patients discharged on the day of surgery will not be allowed to drive home.  Someone NEEDS to stay with you for the first 24 hours after anesthesia.  Do not bring your home medications to the hospital. The Pharmacy will dispense medications listed on your medication list to you during your admission in the Hospital.  Please read over the following fact sheets you were given: IF YOU HAVE QUESTIONS ABOUT YOUR PRE-OP INSTRUCTIONS, PLEASE  CALL [phone]  Joyce Gross)   [facility] - Preparing for Surgery Before surgery, you can play an important role.  Because skin is not sterile, your skin needs to be as free of germs as possible.  You can reduce the number of germs on your skin by washing with CHG (chlorahexidine gluconate) soap before surgery.  CHG is an antiseptic cleaner which kills germs and bonds with the skin to continue killing germs even after washing. Please DO NOT use if you have an allergy to CHG or antibacterial soaps.  If your skin becomes reddened/irritated stop using the CHG and inform your nurse when you arrive at Short Stay. Do not shave (including legs and  underarms) for at least 48 hours prior to the first CHG shower.  You may shave your face/neck.  Please follow these instructions carefully:  1.  Shower with CHG Soap the night before surgery and the  morning of surgery.  2.  If you choose to wash your hair, wash your hair first as usual with your normal  shampoo.  3.  After you shampoo, rinse your hair and body thoroughly to remove the shampoo.                             4.  Use CHG as you would any other liquid soap.  You can apply chg directly to the skin and wash.  Gently with a scrungie or clean washcloth.  5.  Apply the CHG Soap to your body ONLY FROM THE NECK DOWN.   Do not use on face/ open                           Wound or open sores. Avoid contact with eyes, ears mouth and genitals (private parts).                       Wash face,  Genitals (private parts) with your normal soap.             6.  Wash thoroughly, paying special attention to the area where your  surgery  will be performed.  7.  Thoroughly rinse your body with warm water from the neck down.  8.  DO NOT shower/wash with your normal soap after using and rinsing off the CHG Soap.            9.  Pat yourself dry with a clean towel.            10.  Wear clean pajamas.            11.  Place clean sheets on your bed the night of your first shower and do not  sleep with pets.  ON THE DAY OF SURGERY : Do not apply any lotions/deodorants the morning of surgery.  Please wear clean clothes to the hospital/surgery center.    FAILURE TO FOLLOW THESE INSTRUCTIONS MAY RESULT IN THE CANCELLATION OF YOUR SURGERY  PATIENT SIGNATURE_________________________________  NURSE SIGNATURE__________________________________  ________________________________________________________________________

## 2023-08-01 NOTE — Progress Notes (Addendum)
COVID Vaccine received:  []  No [x]  Yes Date of any COVID positive Test in last 90 days:  none  PCP - Eleanora Neighbor, MD at [facility] [phone] Cardiologist - None  Chest x-ray -  EKG -  10-30-2022  Epic Stress Test -  ECHO -  Cardiac Cath -   PCR screen: []  Ordered & Completed []   No Order but Needs PROFEND     [x]   N/A for this surgery  Surgery Plan:  [x]  Ambulatory   []  Outpatient in bed  []  Admit Anesthesia:    [x]  General  []  Spinal  []   Choice []   MAC  Bowel Prep - [x]  No  []   Yes ______  Pacemaker / ICD device [x]  No []  Yes   Spinal Cord Stimulator:[x]  No []  Yes       History of Sleep Apnea? [x]  No []  Yes   CPAP used?- [x]  No []  Yes    Does the patient monitor blood sugar?   [x]  N/A   []  No []  Yes  Patient has: [x]  NO Hx DM   []  Pre-DM   []  DM1  []   DM2  Blood Thinner / Instructions:  none Aspirin Instructions: none  ERAS Protocol Ordered: [x]  No  []  Yes Patient is to be NPO after: Midnight prior  Dental hx: [x]  Dentures: upper plate []  N/A      []  Bridge or Partial:                   []  Loose or Damaged teeth:   Activity level: Patient can climb a flight of stairs without difficulty; [x]  No CP  [x]  No SOB. Patient can perform ADLs without assistance.   Anesthesia review: HTN, Mother slow to wake up, no other pertinent surgical or medical history  Patient denies shortness of breath, fever, cough and chest pain at PAT appointment.  Patient verbalized understanding and agreement to the Pre-Surgical Instructions that were given to them at this PAT appointment. Patient was also educated of the need to review these PAT instructions again prior to his surgery.I reviewed the appropriate phone numbers to call if they have any and questions or concerns.

## 2023-08-02 ENCOUNTER — Encounter (HOSPITAL_COMMUNITY)
Admission: RE | Admit: 2023-08-02 | Discharge: 2023-08-02 | Disposition: A | Discharge status: Home / Self Care | Payer: Medicare Other | Source: Ambulatory Visit | Attending: Urology

## 2023-08-02 ENCOUNTER — Other Ambulatory Visit: Payer: Self-pay

## 2023-08-02 ENCOUNTER — Encounter (HOSPITAL_COMMUNITY): Payer: Self-pay

## 2023-08-02 VITALS — BP 132/78 | HR 83 | Temp 98.0°F | Resp 18 | Ht 70.0 in | Wt 228.0 lb

## 2023-08-02 DIAGNOSIS — Z01818 Encounter for other preprocedural examination: Secondary | ICD-10-CM

## 2023-08-02 DIAGNOSIS — C679 Malignant neoplasm of bladder, unspecified: Secondary | ICD-10-CM | POA: Insufficient documentation

## 2023-08-02 DIAGNOSIS — Z01812 Encounter for preprocedural laboratory examination: Secondary | ICD-10-CM | POA: Insufficient documentation

## 2023-08-02 LAB — CBC
HCT: 46.6 % (ref 39.0–52.0)
Hemoglobin: 16.1 g/dL (ref 13.0–17.0)
MCH: 32.1 pg (ref 26.0–34.0)
MCHC: 34.5 g/dL (ref 30.0–36.0)
MCV: 93 fL (ref 80.0–100.0)
Platelets: 278 10*3/uL (ref 150–400)
RBC: 5.01 MIL/uL (ref 4.22–5.81)
RDW: 13.2 % (ref 11.5–15.5)
WBC: 6.9 10*3/uL (ref 4.0–10.5)
nRBC: 0 % (ref 0.0–0.2)

## 2023-08-02 LAB — BASIC METABOLIC PANEL
Anion gap: 7 (ref 5–15)
BUN: 15 mg/dL (ref 8–23)
CO2: 25 mmol/L (ref 22–32)
Calcium: 8.9 mg/dL (ref 8.9–10.3)
Chloride: 107 mmol/L (ref 98–111)
Creatinine, Ser: 0.94 mg/dL (ref 0.61–1.24)
GFR, Estimated: >60 mL/min (ref >60)
Glucose, Bld: 96 mg/dL (ref 70–99)
Potassium: 4.1 mmol/L (ref 3.5–5.1)
Sodium: 139 mmol/L (ref 135–145)

## 2023-08-09 ENCOUNTER — Ambulatory Visit (HOSPITAL_COMMUNITY): Payer: Medicare Other

## 2023-08-09 ENCOUNTER — Ambulatory Visit (HOSPITAL_COMMUNITY)
Admission: RE | Admit: 2023-08-09 | Discharge: 2023-08-09 | Disposition: A | Discharge status: Home / Self Care | Payer: Medicare Other | Source: Ambulatory Visit | Attending: Urology | Admitting: Urology

## 2023-08-09 ENCOUNTER — Ambulatory Visit (HOSPITAL_BASED_OUTPATIENT_CLINIC_OR_DEPARTMENT_OTHER): Payer: Medicare Other | Admitting: Anesthesiology

## 2023-08-09 ENCOUNTER — Encounter (HOSPITAL_COMMUNITY)
Admission: RE | Disposition: A | Discharge status: Home / Self Care | Payer: Self-pay | Source: Ambulatory Visit | Attending: Urology

## 2023-08-09 ENCOUNTER — Ambulatory Visit (HOSPITAL_COMMUNITY): Payer: Medicare Other | Admitting: Anesthesiology

## 2023-08-09 ENCOUNTER — Encounter (HOSPITAL_COMMUNITY): Payer: Self-pay | Admitting: Urology

## 2023-08-09 DIAGNOSIS — I10 Essential (primary) hypertension: Secondary | ICD-10-CM | POA: Insufficient documentation

## 2023-08-09 DIAGNOSIS — Z87891 Personal history of nicotine dependence: Secondary | ICD-10-CM | POA: Diagnosis not present

## 2023-08-09 DIAGNOSIS — E669 Obesity, unspecified: Secondary | ICD-10-CM | POA: Insufficient documentation

## 2023-08-09 DIAGNOSIS — Z9089 Acquired absence of other organs: Secondary | ICD-10-CM | POA: Diagnosis not present

## 2023-08-09 DIAGNOSIS — N3020 Other chronic cystitis without hematuria: Secondary | ICD-10-CM | POA: Diagnosis not present

## 2023-08-09 DIAGNOSIS — M1990 Unspecified osteoarthritis, unspecified site: Secondary | ICD-10-CM | POA: Diagnosis not present

## 2023-08-09 DIAGNOSIS — D494 Neoplasm of unspecified behavior of bladder: Secondary | ICD-10-CM | POA: Diagnosis not present

## 2023-08-09 DIAGNOSIS — C679 Malignant neoplasm of bladder, unspecified: Secondary | ICD-10-CM

## 2023-08-09 DIAGNOSIS — C675 Malignant neoplasm of bladder neck: Secondary | ICD-10-CM | POA: Diagnosis not present

## 2023-08-09 DIAGNOSIS — Z860100 Personal history of colon polyps, unspecified: Secondary | ICD-10-CM | POA: Diagnosis not present

## 2023-08-09 DIAGNOSIS — Z6832 Body mass index (BMI) 32.0-32.9, adult: Secondary | ICD-10-CM | POA: Diagnosis not present

## 2023-08-09 HISTORY — PX: TRANSURETHRAL RESECTION OF BLADDER TUMOR: SHX2575

## 2023-08-09 HISTORY — PX: CYSTOSCOPY W/ RETROGRADES: SHX1426

## 2023-08-09 SURGERY — TURBT (TRANSURETHRAL RESECTION OF BLADDER TUMOR)
Anesthesia: General | Site: Ureter

## 2023-08-09 MED ORDER — SENNOSIDES-DOCUSATE SODIUM 8.6-50 MG PO TABS: 1.0000 | ORAL_TABLET | Freq: Two times a day (BID) | ORAL | 0 refills | Status: AC

## 2023-08-09 MED ORDER — LACTATED RINGERS IV SOLN: INTRAVENOUS | Status: DC

## 2023-08-09 MED ORDER — FENTANYL CITRATE PF 50 MCG/ML IJ SOSY: 25.0000 ug | PREFILLED_SYRINGE | INTRAMUSCULAR | Status: DC | PRN

## 2023-08-09 MED ORDER — PROPOFOL 10 MG/ML IV BOLUS
INTRAVENOUS | Status: DC | PRN
  Administered 2023-08-09: 180 mg via INTRAVENOUS

## 2023-08-09 MED ORDER — DEXAMETHASONE SODIUM PHOSPHATE 10 MG/ML IJ SOLN
INTRAMUSCULAR | Status: AC
  Filled 2023-08-09: qty 1

## 2023-08-09 MED ORDER — ROCURONIUM BROMIDE 100 MG/10ML IV SOLN
INTRAVENOUS | Status: DC | PRN
  Administered 2023-08-09: 50 mg via INTRAVENOUS

## 2023-08-09 MED ORDER — PROPOFOL 10 MG/ML IV BOLUS
INTRAVENOUS | Status: AC
  Filled 2023-08-09: qty 20

## 2023-08-09 MED ORDER — ESMOLOL HCL 100 MG/10ML IV SOLN
INTRAVENOUS | Status: AC
  Filled 2023-08-09: qty 10

## 2023-08-09 MED ORDER — GENTAMICIN SULFATE 40 MG/ML IJ SOLN
420.0000 mg | INTRAVENOUS | Status: AC
  Administered 2023-08-09: 420 mg via INTRAVENOUS
  Filled 2023-08-09: qty 10.5

## 2023-08-09 MED ORDER — ORAL CARE MOUTH RINSE: 15.0000 mL | Freq: Once | OROMUCOSAL | Status: AC

## 2023-08-09 MED ORDER — LACTATED RINGERS IV SOLN: INTRAVENOUS | Status: DC | PRN

## 2023-08-09 MED ORDER — FENTANYL CITRATE (PF) 100 MCG/2ML IJ SOLN
INTRAMUSCULAR | Status: AC
  Filled 2023-08-09: qty 2

## 2023-08-09 MED ORDER — FENTANYL CITRATE (PF) 100 MCG/2ML IJ SOLN
INTRAMUSCULAR | Status: DC | PRN
  Administered 2023-08-09: 100 ug via INTRAVENOUS

## 2023-08-09 MED ORDER — SUGAMMADEX SODIUM 200 MG/2ML IV SOLN
INTRAVENOUS | Status: DC | PRN
Start: 2023-08-09 — End: 2023-08-09
  Administered 2023-08-09: 380 mg via INTRAVENOUS

## 2023-08-09 MED ORDER — SODIUM CHLORIDE 0.9 % IR SOLN
Status: DC | PRN
  Administered 2023-08-09: 6000 mL via INTRAVESICAL

## 2023-08-09 MED ORDER — ESMOLOL HCL 100 MG/10ML IV SOLN
INTRAVENOUS | Status: DC | PRN
Start: 2023-08-09 — End: 2023-08-09
  Administered 2023-08-09: 30 mg via INTRAVENOUS

## 2023-08-09 MED ORDER — DEXAMETHASONE SODIUM PHOSPHATE 4 MG/ML IJ SOLN
INTRAMUSCULAR | Status: DC | PRN
Start: 2023-08-09 — End: 2023-08-09
  Administered 2023-08-09: 4 mg via INTRAVENOUS

## 2023-08-09 MED ORDER — LIDOCAINE HCL (CARDIAC) PF 100 MG/5ML IV SOSY
PREFILLED_SYRINGE | INTRAVENOUS | Status: DC | PRN
  Administered 2023-08-09: 80 mg via INTRAVENOUS

## 2023-08-09 MED ORDER — CHLORHEXIDINE GLUCONATE 0.12 % MT SOLN
15.0000 mL | Freq: Once | OROMUCOSAL | Status: AC
  Administered 2023-08-09: 15 mL via OROMUCOSAL

## 2023-08-09 MED ORDER — IOHEXOL 300 MG/ML  SOLN
INTRAMUSCULAR | Status: DC | PRN
Start: 2023-08-09 — End: 2023-08-09
  Administered 2023-08-09: 13 mL

## 2023-08-09 MED ORDER — ACETAMINOPHEN 10 MG/ML IV SOLN: 1000.0000 mg | Freq: Once | INTRAVENOUS | Status: DC | PRN

## 2023-08-09 MED ORDER — MIDAZOLAM HCL 5 MG/5ML IJ SOLN
INTRAMUSCULAR | Status: DC | PRN
  Administered 2023-08-09: 2 mg via INTRAVENOUS

## 2023-08-09 MED ORDER — ONDANSETRON HCL 4 MG/2ML IJ SOLN
INTRAMUSCULAR | Status: AC
  Filled 2023-08-09: qty 2

## 2023-08-09 MED ORDER — OXYCODONE-ACETAMINOPHEN 5-325 MG PO TABS: 1.0000 | ORAL_TABLET | Freq: Four times a day (QID) | ORAL | 0 refills | Status: AC | PRN

## 2023-08-09 MED ORDER — ONDANSETRON HCL 4 MG/2ML IJ SOLN
INTRAMUSCULAR | Status: DC | PRN
  Administered 2023-08-09: 4 mg via INTRAVENOUS

## 2023-08-09 MED ORDER — MIDAZOLAM HCL 2 MG/2ML IJ SOLN
INTRAMUSCULAR | Status: AC
  Filled 2023-08-09: qty 2

## 2023-08-09 SURGICAL SUPPLY — 24 items
BAG COUNTER SPONGE SURGICOUNT (BAG) IMPLANT
BAG DRN RND TRDRP ANRFLXCHMBR (UROLOGICAL SUPPLIES)
BAG SPNG CNTER NS LX DISP (BAG)
BAG URINE DRAIN 2000ML AR STRL (UROLOGICAL SUPPLIES) IMPLANT
BAG URO CATCHER STRL LF (MISCELLANEOUS) ×2 IMPLANT
CATH URETL OPEN END 6FR 70 (CATHETERS) IMPLANT
CLOTH BEACON ORANGE TIMEOUT ST (SAFETY) ×2 IMPLANT
DRAPE FOOT SWITCH (DRAPES) ×2 IMPLANT
ELECT REM PT RETURN 15FT ADLT (MISCELLANEOUS) ×2 IMPLANT
EVACUATOR MICROVAS BLADDER (UROLOGICAL SUPPLIES) IMPLANT
GLOVE SURG LX STRL 7.5 STRW (GLOVE) ×2 IMPLANT
GOWN STRL REUS W/ TWL XL LVL3 (GOWN DISPOSABLE) ×2 IMPLANT
GOWN STRL REUS W/TWL XL LVL3 (GOWN DISPOSABLE) ×2
GUIDEWIRE STR DUAL SENSOR (WIRE) ×2 IMPLANT
KIT TURNOVER KIT A (KITS) IMPLANT
LOOP CUT BIPOLAR 24F LRG (ELECTROSURGICAL) IMPLANT
MANIFOLD NEPTUNE II (INSTRUMENTS) ×2 IMPLANT
NS IRRIG 1000ML POUR BTL (IV SOLUTION) IMPLANT
PACK CYSTO (CUSTOM PROCEDURE TRAY) ×2 IMPLANT
PAD PREP 24X48 CUFFED NSTRL (MISCELLANEOUS) ×2 IMPLANT
SYR TOOMEY IRRIG 70ML (MISCELLANEOUS)
SYRINGE TOOMEY IRRIG 70ML (MISCELLANEOUS) IMPLANT
TUBING CONNECTING 10 (TUBING) ×2 IMPLANT
TUBING UROLOGY SET (TUBING) ×2 IMPLANT

## 2023-08-09 NOTE — H&P (Signed)
George Roberts is an 72 y.o. male.    Chief Complaint: Pre-Op Transurethral Resection of Bladder Tumor  HPI:   1 - Large Volume HIgh Grade Bladder Cancer - >7cm multifocal papillary tumors on Ct and office cysto 10/2022 on eval hematuria. Remote >60PY smoker. ? ?Recent Course: ? 10/2022 - T1G3 with negative muscle at TURBT (estimated 90% tumor removed, ?some still at anterior bladder neck),  12/2022 - re-resection negative ==> BCG x 6  ?04/2023 - cysto no recurrence (tiny <16mm ?early papillary tissue bladder neck, too early to tell);  07/2023 cysto with 3 small tumros (about 1cm each) anterior bladder neck.   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 Significant mass effect and medial deviation of Rt ureter. Mass effect may make future urinary diversion problematic.    ?PMH sig for obesity, segmental open colon resection for large polyp (follows Q2 year colonoscopy with GI), no ischemic CV disease / blood thinners. Retired Company secretary. Wife George Roberts is very involved. His PCP is George Roberts. ??  Today " George Roberts " is seen to proceed with TURBT / Retrogrades for recurrent bladder cancer. Hgb 16. Most recent UA without infectious parameters.    Past Medical History:  Diagnosis Date   Family history of adverse reaction to anesthesia    mother--- slow to wake   Hypertension    followed by pcp--- pt stated has nuclear stress test approx 2012 told normal   OA (osteoarthritis)    Wears dentures    upper    Past Surgical History:  Procedure Laterality Date   CARPAL TUNNEL RELEASE Right 08/31/2021   Procedure: CARPAL TUNNEL RELEASE;  Surgeon: Gomez Cleverly, Roberts;  Location: [facility];  Service: Orthopedics;  Laterality: Right;  with local anesthesia   COLONOSCOPY  2019   CYSTOSCOPY W/ RETROGRADES Bilateral 10/31/2022   Procedure: CYSTOSCOPY WITH RETROGRADE PYELOGRAM;  Surgeon: Sebastian Ache, Roberts;   Location: [facility];  Service: Urology;  Laterality: Bilateral;   CYSTOSCOPY W/ RETROGRADES Bilateral 12/12/2022   Procedure: CYSTOSCOPY WITH RETROGRADE PYELOGRAM;  Surgeon: Sebastian Ache, Roberts;  Location: [facility];  Service: Urology;  Laterality: Bilateral;   HAND TENDON SURGERY Left 1998   approx   TONSILLECTOMY     TOTAL HIP ARTHROPLASTY  10/03/2011   Procedure: TOTAL HIP ARTHROPLASTY;  Surgeon: Gus Rankin Aluisio;  Location: [facility];  Service: Orthopedics;  Laterality: Left;   TRANSURETHRAL RESECTION OF BLADDER TUMOR N/A 10/31/2022   Procedure: TRANSURETHRAL RESECTION OF BLADDER TUMOR (TURBT);  Surgeon: Sebastian Ache, Roberts;  Location: [facility];  Service: Urology;  Laterality: N/A;  2 HRS   TRANSURETHRAL RESECTION OF BLADDER TUMOR N/A 12/12/2022   Procedure: TRANSURETHRAL RESECTION OF BLADDER TUMOR (TURBT);  Surgeon: Sebastian Ache, Roberts;  Location: [facility];  Service: Urology;  Laterality: N/A;  75 MINS    No family history on file. Social History:  reports that he quit smoking about 32 years ago. His smoking use included cigarettes. He started smoking about 57 years ago. He does not have any smokeless tobacco history on file. He reports current alcohol use. He reports that he does not use drugs.  Allergies: No Known Allergies  No medications prior to admission.    No results found for this or any previous visit (from the past 48 hour(s)). No results found.  Review of Systems  Constitutional:  Negative for chills and fever.  All other systems reviewed and  are negative.   There were no vitals taken for this visit. Physical Exam Vitals reviewed.  HENT:     Head: Normocephalic.  Eyes:     Pupils: Pupils are equal, round, and reactive to light.  Cardiovascular:     Rate and Rhythm: Normal rate.  Pulmonary:     Effort: Pulmonary effort is normal.  Abdominal:     General: Abdomen is flat.  Genitourinary:    Comments: No CVAT at present Musculoskeletal:        General: Normal range of  motion.     Cervical back: Normal range of motion.  Skin:    General: Skin is warm.  Neurological:     General: No focal deficit present.     Mental Status: He is alert.  Psychiatric:        Mood and Affect: Mood normal.      Assessment/Plan  Proceed as planned with TURBT, Retrogrades. Risks, benefits, alternatives, expected peri-op course discussed previously and reiterated today.   Loletta Parish., Roberts 08/09/2023, 5:41 AM

## 2023-08-09 NOTE — Anesthesia Procedure Notes (Signed)
Procedure Name: Intubation Date/Time: 08/09/2023 1:51 PM  Performed by: Deri Fuelling, CRNAPre-anesthesia Checklist: Patient identified, Emergency Drugs available, Suction available and Patient being monitored Patient Re-evaluated:Patient Re-evaluated prior to induction Oxygen Delivery Method: Circle system utilized Preoxygenation: Pre-oxygenation with 100% oxygen Induction Type: IV induction Ventilation: Mask ventilation without difficulty Laryngoscope Size: Glidescope and 4 Grade View: Grade I Tube type: Oral Tube size: 7.5 mm Number of attempts: 1 Airway Equipment and Method: Stylet and Oral airway Placement Confirmation: ETT inserted through vocal cords under direct vision, positive ETCO2 and breath sounds checked- equal and bilateral Secured at: 23 cm Tube secured with: Tape Dental Injury: Teeth and Oropharynx as per pre-operative assessment

## 2023-08-09 NOTE — Discharge Instructions (Signed)
1 - You may have urinary urgency (bladder spasms) and bloody urine on / off for up to 2 weeks.  This is normal.  2 - Call MD or go to ER for fever >102, severe pain / nausea / vomiting not relieved by medications, or acute change in medical status  

## 2023-08-09 NOTE — Anesthesia Preprocedure Evaluation (Addendum)
Anesthesia Evaluation  Patient identified by MRN, date of birth, ID band Patient awake    Reviewed: Allergy & Precautions, NPO status , Patient's Chart, lab work & pertinent test results  History of Anesthesia Complications (+) history of anesthetic complications  Airway Mallampati: II  TM Distance: >3 FB Neck ROM: Full    Dental no notable dental hx. (+) Upper Dentures   Pulmonary former smoker   Pulmonary exam normal        Cardiovascular hypertension,  Rhythm:Regular Rate:Normal     Neuro/Psych negative neurological ROS  negative psych ROS   GI/Hepatic negative GI ROS, Neg liver ROS,,,  Endo/Other  negative endocrine ROS    Renal/GU negative Renal ROS Bladder dysfunction      Musculoskeletal  (+) Arthritis , Osteoarthritis,    Abdominal Normal abdominal exam  (+)   Peds  Hematology   Anesthesia Other Findings   Reproductive/Obstetrics                             Anesthesia Physical Anesthesia Plan  ASA: 2  Anesthesia Plan: General   Post-op Pain Management:    Induction: Intravenous  PONV Risk Score and Plan: 2 and Ondansetron, Dexamethasone and Treatment may vary due to age or medical condition  Airway Management Planned: Mask and Oral ETT  Additional Equipment: None  Intra-op Plan:   Post-operative Plan: Extubation in OR  Informed Consent: I have reviewed the patients History and Physical, chart, labs and discussed the procedure including the risks, benefits and alternatives for the proposed anesthesia with the patient or authorized representative who has indicated his/her understanding and acceptance.     Dental advisory given  Plan Discussed with: CRNA  Anesthesia Plan Comments:        Anesthesia Quick Evaluation

## 2023-08-09 NOTE — Op Note (Signed)
NAME: LONAS, CALVER MEDICAL RECORD NO: [id number] ACCOUNT NO: [id number] DATE OF BIRTH: May 14, 1951 FACILITY: [facility] LOCATION: [facility] PHYSICIAN: Sebastian Ache, MD  Operative Report   SURGEON:  Sebastian Ache, MD  PREOPERATIVE DIAGNOSIS:  Recurrent bladder tumor.  PROCEDURE: 1.  Transurethral resection of bladder tumor, volume medium. 2.  Bilateral retrograde pyelograms interpretation.  ESTIMATED BLOOD LOSS:  Nil.  COMPLICATIONS:  None.  SPECIMENS: 1.  Bladder tumor. 2.  Base of bladder tumor.  FINDINGS: 1.  Three individual foci of relatively small volume, low-grade appearing bladder tumor, mostly at the 10 o'clock bladder neck prostatic urethral area.  Total volume approximately 3 cm2. 2.  Unremarkable retrograde pyelograms.  INDICATIONS:  The patient is a pleasant 72 year old man who has a history of large volume stage I high-grade bladder cancer.  He underwent transurethral resection, then BCG induction and has not been very compliant with surveillance.  He was found on  surveillance cystoscopy to have very small foci 3 separate months concerning for recurrent tumor.  Options were discussed including recommended path of transurethral resection for diagnostic and therapeutic intent.  He presents for this today.  Informed  consent was obtained and placed in medical record.  PROCEDURE IN DETAIL:  The patient being Maya Oria verified and procedure being transurethral resection of bladder tumor and retrogrades confirmed.  Procedure timeout was performed.  Intravenous antibiotics were administered.  General anesthesia  introduced.  The patient was placed into a low lithotomy position.  Sterile field was created, prepped and draped the patient's penis, perineum, and proximal thighs using iodine.  Cystourethroscopy was performed using 21-French rigid cystoscope offset  lens.  Inspection of anterior and posterior urethra revealed some moderate trilobar prostatic hypertrophy.   This was stable.  Inspection of urinary bladder revealed old resection site on the right bladder consistent with his prior focus of large tumor.   This site was completely unremarkable. Bilateral ureteral orifices were single.  The left ureteral orifice was cannulated with 6-French end-hole catheter, and a left retrograde pyelogram was obtained,  Left retrograde pyelogram demonstrated a single left ureter, single system left kidney.  No filling defects or narrowing noted.  Next, right retrograde pyelogram was obtained.  Right retrograde pyelogram demonstrated single right ureter, single system right kidney.  No filling defects or narrowing noted.  Initially, I had difficulty appreciating the foci of recurrent tumor and a flexible cystoscope was used and pushed in to  provide better visualization of the bladder neck.  With flexible technique the foci were then visualized, each focus was again approximately 1 cm at roughly the 10 o'clock area of the bladder neck prostatic urethra junction.  Given his habitus, this was somewhat acutely  angled. Having corroborated exact location of the scope of the tumor,  cystoscope was exchanged for the 26-French resectoscope sheath with visual obturator and using resectoscope loop, each focus of tumor was carefully resected down to superficial  fibromuscular stroma of the urinary bladder.  Individual specimens were relatively quite small.  Total surface area of skin approximately 3 cm2 as there was 3 separate 1 cm foci. These were irrigated and set aside for permanent pathology, labeled as  bladder tumor.  Additional sampling was obtained of the base area of the dominant focus.  This was essentially over the area of the prostatic urethra.  This set aside, labeled as base of bladder tumor.  These foci were then fulgurated.  Hemostasis was  excellent.  Bladder was partially empty per cystoscope.  Procedure was  then terminated.  The patient tolerated the procedure well, no  immediate periprocedural complications.  The patient was taken to postanesthesia care unit in stable condition.  Plan  for discharge home after void.   NIK D: 08/09/2023 2:15:33 pm T: 08/09/2023 10:14:00 pm  JOB: [id number]/ [id number]

## 2023-08-09 NOTE — Transfer of Care (Signed)
Immediate Anesthesia Transfer of Care Note  Patient: George Roberts  Procedure(s) Performed: TRANSURETHRAL RESECTION OF BLADDER TUMOR (TURBT) (Bladder) CYSTOSCOPY WITH BILATERAL RETROGRADE (Bilateral: Ureter)  Patient Location: PACU  Anesthesia Type:General  Level of Consciousness: awake and alert   Airway & Oxygen Therapy: Patient Spontanous Breathing and Patient connected to face mask oxygen  Post-op Assessment: Report given to RN and Post -op Vital signs reviewed and stable  Post vital signs: Reviewed and stable  Last Vitals:  Vitals Value Taken Time  BP    Temp    Pulse 89 08/09/23 1424  Resp 11 08/09/23 1424  SpO2 95 % 08/09/23 1424  Vitals shown include unfiled device data.  Last Pain:  Vitals:   08/09/23 1134  TempSrc:   PainSc: 0-No pain      Patients Stated Pain Goal: 4 (08/09/23 1134)  Complications: No notable events documented.

## 2023-08-09 NOTE — Brief Op Note (Signed)
08/09/2023  2:10 PM  PATIENT:  George Roberts  72 y.o. male  PRE-OPERATIVE DIAGNOSIS:  RECURRENT BLADDER CANCER  POST-OPERATIVE DIAGNOSIS:  RECURRENT BLADDER CANCER  PROCEDURE:  Procedure(s) with comments: TRANSURETHRAL RESECTION OF BLADDER TUMOR (TURBT) (N/A) CYSTOSCOPY WITH BILATERAL RETROGRADE (Bilateral) - 45 MINS FOR CASE  SURGEON:  Surgeons and Role:    * Trish Mancinelli, Delbert Phenix., MD - Primary  PHYSICIAN ASSISTANT:   ASSISTANTS: none   ANESTHESIA:   general  EBL:  minimal   BLOOD ADMINISTERED:none  DRAINS: none   LOCAL MEDICATIONS USED:  NONE  SPECIMEN:  Source of Specimen:  1 - bladder tumor; 2 - base of bladder tumor  DISPOSITION OF SPECIMEN:  PATHOLOGY  COUNTS:  YES  TOURNIQUET:  * No tourniquets in log *  DICTATION: .Other Dictation: Dictation Number [id number]  PLAN OF CARE: Discharge to home after PACU  PATIENT DISPOSITION:  PACU - hemodynamically stable.   Delay start of Pharmacological VTE agent (>24hrs) due to surgical blood loss or risk of bleeding: yes

## 2023-08-12 ENCOUNTER — Encounter (HOSPITAL_COMMUNITY): Payer: Self-pay | Admitting: Urology

## 2023-08-12 NOTE — Anesthesia Postprocedure Evaluation (Signed)
Anesthesia Post Note  Patient: GREGOREY NABOR  Procedure(s) Performed: TRANSURETHRAL RESECTION OF BLADDER TUMOR (TURBT) (Bladder) CYSTOSCOPY WITH BILATERAL RETROGRADE (Bilateral: Ureter)     Patient location during evaluation: PACU Anesthesia Type: General Level of consciousness: awake and alert Pain management: pain level controlled Vital Signs Assessment: post-procedure vital signs reviewed and stable Respiratory status: spontaneous breathing, nonlabored ventilation, respiratory function stable and patient connected to nasal cannula oxygen Cardiovascular status: blood pressure returned to baseline and stable Postop Assessment: no apparent nausea or vomiting Anesthetic complications: no   No notable events documented.  Last Vitals:  Vitals:   08/09/23 1500 08/09/23 1510  BP: (!) 158/87 (!) 155/88  Pulse: 81 71  Resp: 17   Temp:    SpO2: 94% 94%    Last Pain:  Vitals:   08/09/23 1510  TempSrc:   PainSc: 0-No pain                 Earl Lites P Tanda Morrissey

## 2023-08-13 LAB — SURGICAL PATHOLOGY

## 2023-08-14 DIAGNOSIS — Z79899 Other long term (current) drug therapy: Secondary | ICD-10-CM | POA: Diagnosis not present

## 2023-08-14 DIAGNOSIS — E7800 Pure hypercholesterolemia, unspecified: Secondary | ICD-10-CM | POA: Diagnosis not present

## 2023-08-14 DIAGNOSIS — I10 Essential (primary) hypertension: Secondary | ICD-10-CM | POA: Diagnosis not present

## 2023-08-19 DIAGNOSIS — D175 Benign lipomatous neoplasm of intra-abdominal organs: Secondary | ICD-10-CM | POA: Diagnosis not present

## 2023-08-19 DIAGNOSIS — C678 Malignant neoplasm of overlapping sites of bladder: Secondary | ICD-10-CM | POA: Diagnosis not present

## 2023-11-25 DIAGNOSIS — C678 Malignant neoplasm of overlapping sites of bladder: Secondary | ICD-10-CM | POA: Diagnosis not present

## 2024-02-24 DIAGNOSIS — R310 Gross hematuria: Secondary | ICD-10-CM | POA: Diagnosis not present

## 2024-02-24 DIAGNOSIS — D175 Benign lipomatous neoplasm of intra-abdominal organs: Secondary | ICD-10-CM | POA: Diagnosis not present

## 2024-02-24 DIAGNOSIS — C678 Malignant neoplasm of overlapping sites of bladder: Secondary | ICD-10-CM | POA: Diagnosis not present

## 2024-04-29 DIAGNOSIS — H02834 Dermatochalasis of left upper eyelid: Secondary | ICD-10-CM | POA: Diagnosis not present

## 2024-04-29 DIAGNOSIS — H2513 Age-related nuclear cataract, bilateral: Secondary | ICD-10-CM | POA: Diagnosis not present

## 2024-04-29 DIAGNOSIS — H02831 Dermatochalasis of right upper eyelid: Secondary | ICD-10-CM | POA: Diagnosis not present

## 2024-04-29 DIAGNOSIS — H029 Unspecified disorder of eyelid: Secondary | ICD-10-CM | POA: Diagnosis not present

## 2024-05-01 DIAGNOSIS — D485 Neoplasm of uncertain behavior of skin: Secondary | ICD-10-CM | POA: Diagnosis not present

## 2024-05-01 DIAGNOSIS — D2262 Melanocytic nevi of left upper limb, including shoulder: Secondary | ICD-10-CM | POA: Diagnosis not present

## 2024-05-01 DIAGNOSIS — L821 Other seborrheic keratosis: Secondary | ICD-10-CM | POA: Diagnosis not present

## 2024-05-01 DIAGNOSIS — L720 Epidermal cyst: Secondary | ICD-10-CM | POA: Diagnosis not present

## 2024-05-01 DIAGNOSIS — Z85828 Personal history of other malignant neoplasm of skin: Secondary | ICD-10-CM | POA: Diagnosis not present

## 2024-05-01 DIAGNOSIS — L570 Actinic keratosis: Secondary | ICD-10-CM | POA: Diagnosis not present

## 2024-05-01 DIAGNOSIS — L918 Other hypertrophic disorders of the skin: Secondary | ICD-10-CM | POA: Diagnosis not present

## 2024-05-01 DIAGNOSIS — D225 Melanocytic nevi of trunk: Secondary | ICD-10-CM | POA: Diagnosis not present

## 2024-05-28 ENCOUNTER — Encounter: Payer: Self-pay | Admitting: Podiatry

## 2024-05-28 ENCOUNTER — Ambulatory Visit (INDEPENDENT_AMBULATORY_CARE_PROVIDER_SITE_OTHER): Admitting: Podiatry

## 2024-05-28 DIAGNOSIS — M65971 Unspecified synovitis and tenosynovitis, right ankle and foot: Secondary | ICD-10-CM

## 2024-05-28 DIAGNOSIS — I872 Venous insufficiency (chronic) (peripheral): Secondary | ICD-10-CM | POA: Diagnosis not present

## 2024-05-28 DIAGNOSIS — I890 Lymphedema, not elsewhere classified: Secondary | ICD-10-CM | POA: Diagnosis not present

## 2024-05-28 DIAGNOSIS — M65972 Unspecified synovitis and tenosynovitis, left ankle and foot: Secondary | ICD-10-CM | POA: Diagnosis not present

## 2024-05-28 NOTE — Progress Notes (Signed)
 Patient presents with complaint of swelling around ankles and lower legs.  It has been bothering him more.  He [place] with standing and walking a lot.  Has not noticed any redness or drainage.  No blisters noted.   Physical exam:  General appearance: Pleasant, and in no acute distress. AOx3.  Vascular: Pedal pulses: DP 2/4 bilaterally, PT 2 to/4 bilaterally.  Moderate edema lower legs bilaterally. Capillary fill time immediate bilaterally.  Neurological Light touch intact feet bilaterally.  Normal Achilles reflex bilaterally.  No clonus or spasticity noted.   Dermatologic:   Skin normal temperature bilaterally.  Skin normal color, tone, and texture bilaterally.   Musculoskeletal: Some soreness with palpation of the sinus tarsi bilaterally.  Normal muscle strength     Diagnosis: 1 synovitis feet bilaterally. 2 lymphedema lower extremity secondary to venous insufficiency bilaterally  Plan: -New patient office visit for evaluation management level 3. - Discussed with him lymphedema from venous insufficiency.  Discussed etiology and treatment.  Some of the synovitis may be coming from the subtalar joint.  Recommended, below the knee compression hose 20 to 30 mmHg open or close toed.  Recommended elevation and discussed some exercises he can do.  Limit salt intake to less than 2000 mg a day   Return as needed

## 2024-06-01 DIAGNOSIS — C678 Malignant neoplasm of overlapping sites of bladder: Secondary | ICD-10-CM | POA: Diagnosis not present

## 2024-07-16 DIAGNOSIS — H02422 Myogenic ptosis of left eyelid: Secondary | ICD-10-CM | POA: Diagnosis not present

## 2024-07-16 DIAGNOSIS — H02834 Dermatochalasis of left upper eyelid: Secondary | ICD-10-CM | POA: Diagnosis not present

## 2024-07-16 DIAGNOSIS — H0288B Meibomian gland dysfunction left eye, upper and lower eyelids: Secondary | ICD-10-CM | POA: Diagnosis not present

## 2024-07-16 DIAGNOSIS — H02135 Senile ectropion of left lower eyelid: Secondary | ICD-10-CM | POA: Diagnosis not present

## 2024-07-16 DIAGNOSIS — H02132 Senile ectropion of right lower eyelid: Secondary | ICD-10-CM | POA: Diagnosis not present

## 2024-07-16 DIAGNOSIS — H02831 Dermatochalasis of right upper eyelid: Secondary | ICD-10-CM | POA: Diagnosis not present

## 2024-07-16 DIAGNOSIS — H0288A Meibomian gland dysfunction right eye, upper and lower eyelids: Secondary | ICD-10-CM | POA: Diagnosis not present

## 2024-07-16 DIAGNOSIS — H57813 Brow ptosis, bilateral: Secondary | ICD-10-CM | POA: Diagnosis not present

## 2024-07-31 DIAGNOSIS — Z23 Encounter for immunization: Secondary | ICD-10-CM | POA: Diagnosis not present

## 2024-08-03 DIAGNOSIS — Z0000 Encounter for general adult medical examination without abnormal findings: Secondary | ICD-10-CM | POA: Diagnosis not present

## 2024-08-03 DIAGNOSIS — Z1389 Encounter for screening for other disorder: Secondary | ICD-10-CM | POA: Diagnosis not present

## 2024-08-03 DIAGNOSIS — Z23 Encounter for immunization: Secondary | ICD-10-CM | POA: Diagnosis not present

## 2024-08-10 DIAGNOSIS — H02834 Dermatochalasis of left upper eyelid: Secondary | ICD-10-CM | POA: Diagnosis not present

## 2024-08-10 DIAGNOSIS — H0288B Meibomian gland dysfunction left eye, upper and lower eyelids: Secondary | ICD-10-CM | POA: Diagnosis not present

## 2024-08-10 DIAGNOSIS — H0288A Meibomian gland dysfunction right eye, upper and lower eyelids: Secondary | ICD-10-CM | POA: Diagnosis not present

## 2024-08-10 DIAGNOSIS — H02135 Senile ectropion of left lower eyelid: Secondary | ICD-10-CM | POA: Diagnosis not present

## 2024-08-10 DIAGNOSIS — H02132 Senile ectropion of right lower eyelid: Secondary | ICD-10-CM | POA: Diagnosis not present

## 2024-08-10 DIAGNOSIS — H57813 Brow ptosis, bilateral: Secondary | ICD-10-CM | POA: Diagnosis not present

## 2024-08-10 DIAGNOSIS — H02831 Dermatochalasis of right upper eyelid: Secondary | ICD-10-CM | POA: Diagnosis not present

## 2024-08-10 DIAGNOSIS — H02423 Myogenic ptosis of bilateral eyelids: Secondary | ICD-10-CM | POA: Diagnosis not present

## 2024-08-20 DIAGNOSIS — Z79899 Other long term (current) drug therapy: Secondary | ICD-10-CM | POA: Diagnosis not present

## 2024-08-20 DIAGNOSIS — Z1211 Encounter for screening for malignant neoplasm of colon: Secondary | ICD-10-CM | POA: Diagnosis not present

## 2024-08-20 DIAGNOSIS — I10 Essential (primary) hypertension: Secondary | ICD-10-CM | POA: Diagnosis not present

## 2024-08-20 DIAGNOSIS — R809 Proteinuria, unspecified: Secondary | ICD-10-CM | POA: Diagnosis not present

## 2024-08-20 DIAGNOSIS — E7800 Pure hypercholesterolemia, unspecified: Secondary | ICD-10-CM | POA: Diagnosis not present

## 2024-08-20 DIAGNOSIS — N401 Enlarged prostate with lower urinary tract symptoms: Secondary | ICD-10-CM | POA: Diagnosis not present

## 2024-08-20 DIAGNOSIS — Z980 Intestinal bypass and anastomosis status: Secondary | ICD-10-CM | POA: Diagnosis not present

## 2024-08-20 DIAGNOSIS — K9089 Other intestinal malabsorption: Secondary | ICD-10-CM | POA: Diagnosis not present

## 2024-08-20 DIAGNOSIS — Z860100 Personal history of colon polyps, unspecified: Secondary | ICD-10-CM | POA: Diagnosis not present

## 2024-08-20 DIAGNOSIS — M62830 Muscle spasm of back: Secondary | ICD-10-CM | POA: Diagnosis not present

## 2024-08-20 DIAGNOSIS — N529 Male erectile dysfunction, unspecified: Secondary | ICD-10-CM | POA: Diagnosis not present

## 2024-08-20 DIAGNOSIS — Z125 Encounter for screening for malignant neoplasm of prostate: Secondary | ICD-10-CM | POA: Diagnosis not present

## 2024-10-15 DIAGNOSIS — R051 Acute cough: Secondary | ICD-10-CM | POA: Diagnosis not present
